# Patient Record
Sex: Female | Born: 1955
Health system: Southern US, Community
[De-identification: ages and names within clinical notes are randomized; demographics above are authoritative.]

## PROBLEM LIST (undated history)

## (undated) DIAGNOSIS — Z8489 Family history of other specified conditions: Secondary | ICD-10-CM

## (undated) DIAGNOSIS — H9190 Unspecified hearing loss, unspecified ear: Secondary | ICD-10-CM

## (undated) DIAGNOSIS — Z9889 Other specified postprocedural states: Secondary | ICD-10-CM

## (undated) DIAGNOSIS — K219 Gastro-esophageal reflux disease without esophagitis: Secondary | ICD-10-CM

## (undated) DIAGNOSIS — F329 Major depressive disorder, single episode, unspecified: Secondary | ICD-10-CM

## (undated) DIAGNOSIS — F32A Depression, unspecified: Secondary | ICD-10-CM

## (undated) DIAGNOSIS — D649 Anemia, unspecified: Secondary | ICD-10-CM

## (undated) DIAGNOSIS — M1990 Unspecified osteoarthritis, unspecified site: Secondary | ICD-10-CM

## (undated) DIAGNOSIS — R112 Nausea with vomiting, unspecified: Secondary | ICD-10-CM

## (undated) DIAGNOSIS — G4730 Sleep apnea, unspecified: Secondary | ICD-10-CM

## (undated) DIAGNOSIS — T8859XA Other complications of anesthesia, initial encounter: Secondary | ICD-10-CM

## (undated) DIAGNOSIS — E039 Hypothyroidism, unspecified: Secondary | ICD-10-CM

## (undated) DIAGNOSIS — T4145XA Adverse effect of unspecified anesthetic, initial encounter: Secondary | ICD-10-CM

## (undated) HISTORY — PX: OTHER SURGICAL HISTORY: SHX169

## (undated) HISTORY — PX: TONSILLECTOMY: SUR1361

## (undated) HISTORY — PX: COLONOSCOPY: SHX174

---

## 1998-12-01 ENCOUNTER — Other Ambulatory Visit: Admission: RE | Admit: 1998-12-01 | Discharge: 1998-12-01 | Payer: Self-pay | Admitting: Obstetrics and Gynecology

## 1999-02-15 ENCOUNTER — Other Ambulatory Visit: Admission: RE | Admit: 1999-02-15 | Discharge: 1999-02-15 | Payer: Self-pay | Admitting: Obstetrics and Gynecology

## 1999-12-07 ENCOUNTER — Other Ambulatory Visit: Admission: RE | Admit: 1999-12-07 | Discharge: 1999-12-07 | Payer: Self-pay | Admitting: Obstetrics and Gynecology

## 2000-12-26 ENCOUNTER — Other Ambulatory Visit: Admission: RE | Admit: 2000-12-26 | Discharge: 2000-12-26 | Payer: Self-pay | Admitting: Obstetrics and Gynecology

## 2004-03-29 ENCOUNTER — Other Ambulatory Visit: Admission: RE | Admit: 2004-03-29 | Discharge: 2004-03-29 | Payer: Self-pay | Admitting: Obstetrics and Gynecology

## 2005-04-04 ENCOUNTER — Other Ambulatory Visit: Admission: RE | Admit: 2005-04-04 | Discharge: 2005-04-04 | Payer: Self-pay | Admitting: Obstetrics and Gynecology

## 2005-11-19 HISTORY — PX: CARPAL TUNNEL RELEASE: SHX101

## 2006-04-24 ENCOUNTER — Other Ambulatory Visit: Admission: RE | Admit: 2006-04-24 | Discharge: 2006-04-24 | Payer: Self-pay | Admitting: Obstetrics and Gynecology

## 2006-11-08 ENCOUNTER — Ambulatory Visit (HOSPITAL_BASED_OUTPATIENT_CLINIC_OR_DEPARTMENT_OTHER): Admission: RE | Admit: 2006-11-08 | Discharge: 2006-11-08 | Payer: Self-pay | Admitting: Orthopedic Surgery

## 2007-04-28 ENCOUNTER — Other Ambulatory Visit: Admission: RE | Admit: 2007-04-28 | Discharge: 2007-04-28 | Payer: Self-pay | Admitting: Obstetrics and Gynecology

## 2008-05-18 ENCOUNTER — Other Ambulatory Visit: Admission: RE | Admit: 2008-05-18 | Discharge: 2008-05-18 | Payer: Self-pay | Admitting: Obstetrics and Gynecology

## 2008-11-19 HISTORY — PX: ABDOMINAL HYSTERECTOMY: SHX81

## 2008-11-19 HISTORY — PX: ANTERIOR AND POSTERIOR REPAIR: SHX1172

## 2009-10-04 ENCOUNTER — Ambulatory Visit (HOSPITAL_COMMUNITY): Admission: RE | Admit: 2009-10-04 | Discharge: 2009-10-05 | Payer: Self-pay | Admitting: Obstetrics and Gynecology

## 2009-10-04 ENCOUNTER — Encounter: Payer: Self-pay | Admitting: Obstetrics and Gynecology

## 2011-02-21 LAB — COMPREHENSIVE METABOLIC PANEL
Alkaline Phosphatase: 71 U/L (ref 39–117)
BUN: 11 mg/dL (ref 6–23)
Chloride: 107 mEq/L (ref 96–112)
Glucose, Bld: 76 mg/dL (ref 70–99)
Potassium: 4.2 mEq/L (ref 3.5–5.1)
Total Bilirubin: 0.8 mg/dL (ref 0.3–1.2)

## 2011-02-21 LAB — TYPE AND SCREEN
ABO/RH(D): O NEG
Antibody Screen: NEGATIVE

## 2011-02-21 LAB — CBC
HCT: 30.9 % — ABNORMAL LOW (ref 36.0–46.0)
HCT: 41.8 % (ref 36.0–46.0)
Hemoglobin: 14.3 g/dL (ref 12.0–15.0)
MCV: 94.2 fL (ref 78.0–100.0)
MCV: 94.3 fL (ref 78.0–100.0)
Platelets: 152 10*3/uL (ref 150–400)
Platelets: 165 10*3/uL (ref 150–400)
RBC: 3.26 MIL/uL — ABNORMAL LOW (ref 3.87–5.11)
RDW: 12.7 % (ref 11.5–15.5)
WBC: 7.4 10*3/uL (ref 4.0–10.5)
WBC: 8.6 10*3/uL (ref 4.0–10.5)
WBC: 5.7 10*3/uL (ref 4.0–10.5)

## 2011-02-21 LAB — HCG, SERUM, QUALITATIVE: Preg, Serum: NEGATIVE

## 2011-02-21 LAB — PROTIME-INR: Prothrombin Time: 13.1 seconds (ref 11.6–15.2)

## 2011-03-21 ENCOUNTER — Other Ambulatory Visit: Payer: Self-pay

## 2011-03-21 DIAGNOSIS — R52 Pain, unspecified: Secondary | ICD-10-CM

## 2011-03-21 DIAGNOSIS — R609 Edema, unspecified: Secondary | ICD-10-CM

## 2011-03-23 ENCOUNTER — Ambulatory Visit
Admission: RE | Admit: 2011-03-23 | Discharge: 2011-03-23 | Disposition: A | Discharge status: Home / Self Care | Payer: Self-pay | Source: Ambulatory Visit

## 2011-03-23 ENCOUNTER — Ambulatory Visit
Admission: RE | Admit: 2011-03-23 | Discharge: 2011-03-23 | Disposition: A | Discharge status: Home / Self Care | Payer: BC Managed Care – PPO | Source: Ambulatory Visit

## 2011-03-23 DIAGNOSIS — R52 Pain, unspecified: Secondary | ICD-10-CM

## 2011-03-23 DIAGNOSIS — R609 Edema, unspecified: Secondary | ICD-10-CM

## 2011-04-06 NOTE — Op Note (Signed)
NAMEORLANDRIA, KISSNER              ACCOUNT NO.:  [id number]   MEDICAL RECORD NO.:  [id number]          PATIENT TYPE:  AMB   LOCATION:  DSC                          FACILITY:  [facility]   PHYSICIAN:  Katy Fitch. Sypher, M.D. DATE OF BIRTH:  24-Jul-1956   DATE OF PROCEDURE:  11/08/2006  DATE OF DISCHARGE:                               OPERATIVE REPORT   PREOPERATIVE DIAGNOSIS:  Entrapment neuropathy of median nerve, left  carpal tunnel.   POSTOPERATIVE DIAGNOSIS:  Entrapment neuropathy of median nerve, left  carpal tunnel.   OPERATIONS:  Release of left transverse carpal ligament.   OPERATIONS:  Josephine Igo, MD   ASSISTANT:  Annye Rusk PA-C.   ANESTHESIA:  General by LMA.   SUPERVISING ANESTHESIOLOGIST:  Bedelia Person, MD   INDICATIONS:  Kristin [name] is a 55 year old woman referred by Dr.  Lucila Maine of [place], [place] for evaluation and management  of hand numbness.  She is status post release of her right transverse  carpal ligament for painful entrapment neuropathy.  She now requests  release of the left transverse carpal ligament.   Due to a failure to respond to nonoperative measures, she is brought to  the operating room at this time.   PROCEDURE:  Devyn Gombert was brought to the operating room and placed  in supine position upon the operating table.   Following the induction of general anesthesia by LMA technique, the left  arm was prepped with Betadine soap and solution and sterilely draped.  A  pneumatic tourniquet was applied to the proximal left brachium.   Following exsanguination of the left arm with an Esmarch bandage, an  arterial tourniquet on the proximal brachium was inflated to 220 mmHg.   The procedure commenced with short incision in the line of the ring  finger and the palm.  Subcutaneous tissues were carefully divided,  revealing the palmar fascia.  This was split longitudinally to reveal  the common sensory branch of the median nerve.   These were followed back  to the transverse carpal ligament, which was gently isolated from the  median nerve.   The ligament was then released along its ulnar border, extending into  the distal forearm.  This widely opened the carpal canal.  No masses or  other predicaments were noted.   Bleeding points along the margin of the released ligament were  electrocauterized with bipolar current followed by repair of the skin  with intradermal 3-0 Prolene suture.   A compressive dressing was applied with a volar plaster splint to  maintain the wrist in 5 degrees of dorsiflexion.  For aftercare, Ms.  Fickel is provided a prescription for Percocet 5 mg one p.o. q.4-6 h.  p.r.n. pain, 20 tablets without refill.   She will return to see me in the office for followup in 1 week or sooner  p.r.n. problems.      Katy Fitch Sypher, M.D.  Electronically Signed     RVS/MEDQ  D:  11/08/2006  T:  11/09/2006  Job:  [id number]   cc:   Angeline Slim MD

## 2011-11-20 HISTORY — PX: RECTAL PROLAPSE REPAIR: SHX759

## 2012-01-22 DIAGNOSIS — E038 Other specified hypothyroidism: Secondary | ICD-10-CM | POA: Insufficient documentation

## 2013-01-07 DIAGNOSIS — N3281 Overactive bladder: Secondary | ICD-10-CM | POA: Insufficient documentation

## 2013-01-07 DIAGNOSIS — N816 Rectocele: Secondary | ICD-10-CM | POA: Insufficient documentation

## 2013-01-07 DIAGNOSIS — N952 Postmenopausal atrophic vaginitis: Secondary | ICD-10-CM | POA: Insufficient documentation

## 2013-02-18 ENCOUNTER — Other Ambulatory Visit (HOSPITAL_COMMUNITY): Payer: Self-pay | Admitting: Orthopedic Surgery

## 2013-02-18 DIAGNOSIS — M25512 Pain in left shoulder: Secondary | ICD-10-CM

## 2013-02-18 DIAGNOSIS — M75101 Unspecified rotator cuff tear or rupture of right shoulder, not specified as traumatic: Secondary | ICD-10-CM

## 2013-02-24 ENCOUNTER — Ambulatory Visit (HOSPITAL_COMMUNITY)
Admission: RE | Admit: 2013-02-24 | Discharge: 2013-02-24 | Disposition: A | Discharge status: Home / Self Care | Payer: BC Managed Care – PPO | Source: Ambulatory Visit | Attending: Orthopedic Surgery | Admitting: Orthopedic Surgery

## 2013-02-24 DIAGNOSIS — M75100 Unspecified rotator cuff tear or rupture of unspecified shoulder, not specified as traumatic: Secondary | ICD-10-CM | POA: Insufficient documentation

## 2013-02-24 DIAGNOSIS — M75101 Unspecified rotator cuff tear or rupture of right shoulder, not specified as traumatic: Secondary | ICD-10-CM

## 2013-02-24 DIAGNOSIS — IMO0002 Reserved for concepts with insufficient information to code with codable children: Secondary | ICD-10-CM | POA: Insufficient documentation

## 2013-02-24 DIAGNOSIS — M25512 Pain in left shoulder: Secondary | ICD-10-CM

## 2013-03-24 ENCOUNTER — Encounter (HOSPITAL_BASED_OUTPATIENT_CLINIC_OR_DEPARTMENT_OTHER): Payer: Self-pay | Admitting: *Deleted

## 2013-03-24 NOTE — Progress Notes (Signed)
Denies any cardiac or resp problems-no labs needed-called dr Lorin Picket for any notes-sleep apnea-uses a cpap-to bring all meds, cpap, and overnight bag

## 2013-03-27 ENCOUNTER — Other Ambulatory Visit: Payer: Self-pay | Admitting: Orthopedic Surgery

## 2013-03-30 NOTE — H&P (Signed)
Kristin [name] is an 57 y.o. female.   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 I have known her and her husband for years. She had a history of a fall at home on 07-15-12 sustaining a significant injury to her right shoulder. She was seen by Dr. Lucila Maine where x-rays revealed no fracture. She went to see Dr. Lynnette Caffey. Dr. Lynnette Caffey identified weakness and sent her for an MRI on 08-28-12. This revealed a massive tear of the supraspinatus and subscapularis   Past Medical History  Diagnosis Date  . Arthritis   . GERD (gastroesophageal reflux disease)   . Sleep apnea     uses a cpap  . Hypothyroidism   . Depression   . Anemia     takes OTC iron  . HOH (hard of hearing)   . Family history of anesthesia complication     mom gets nausea    Past Surgical History  Procedure Laterality Date  . Tonsillectomy    . Abdominal hysterectomy  2010    total vag hyst  . Anterior and posterior repair  2010    cystcele/rectocele done with hyst  . Rectal prolapse repair  2013  . Carpal tunnel release  2007    left-DSC  . Colonoscopy      History reviewed. No pertinent family history. Social History:  reports that she has never smoked. She does not have any smokeless tobacco history on file. She reports that she does not drink alcohol or use illicit drugs.  Allergies:  Allergies  Allergen Reactions  . Dilaudid (Hydromorphone Hcl) Nausea And Vomiting    itching    No prescriptions prior to admission    No results found for this or any previous visit (from the past 48 hour(s)).  No results found.   Pertinent items are noted in HPI.  Height 5\' 2"  (1.575 m), weight 90.719 kg (200 lb).  General appearance: alert Head: Normocephalic, without obvious abnormality Neck: supple, symmetrical, trachea midline Resp: clear to auscultation bilaterally Cardio:  regular rate and rhythm GI: normal findings: bowel sounds normal Extremities:. Her shoulder ROM reveals combined elevation right shoulder 170, left shoulder 175, external rotation right and left shoulders 90 degrees symmetrical bilaterally, internal rotation T10 right and T12 left. She has weakness of abduction, weakness of scaption, and weakness of external rotation at neutral and at 90 degrees abduction. She has a painful but intact push off test with more strength on the left than the right. Despite the appearance of her MRI she appears to have at least half of her subscapularis intact.  Pulses: 2+ and symmetric Skin: normal Neurologic: Grossly normal    Assessment/Plan Impression:Right shoulder impingement with A/C arthrosis and massive multitendon rotator cuff tear.  This is a chronic predicament and I am the third orthopaedic MD consulted.  I have recommended that we scope the shoulder to determine the possibility for repair and plan to attempt partial or complete repair of the tendons as our findings dictate.  No guarantees have been made or implied.  This is a " best effort" to salvage a desperate predicament.  Mr and Mrs Gotay have thoughtfully considered my recommendations for several weeks and have chosen to proceed at this time.  Plan:To the OR for right SA with SAD/DCR and RC repair as needed.The procedure, risks,benefits and post-op course were discussed with the patient at length and  they were in agreement with the plan.   DASNOIT,Kristin [name] 03/30/2013, 10:24 PM  H&P documentation: 03/31/2013  -History and Physical Reviewed  -Patient has been re-examined  -No change in the plan of care  Wyn Forster, MD

## 2013-03-31 ENCOUNTER — Encounter (HOSPITAL_BASED_OUTPATIENT_CLINIC_OR_DEPARTMENT_OTHER): Payer: Self-pay | Admitting: Anesthesiology

## 2013-03-31 ENCOUNTER — Ambulatory Visit (HOSPITAL_BASED_OUTPATIENT_CLINIC_OR_DEPARTMENT_OTHER)
Admission: RE | Admit: 2013-03-31 | Discharge: 2013-04-01 | Disposition: A | Discharge status: Home / Self Care | Payer: BC Managed Care – PPO | Source: Ambulatory Visit | Attending: Orthopedic Surgery | Admitting: Orthopedic Surgery

## 2013-03-31 ENCOUNTER — Encounter (HOSPITAL_BASED_OUTPATIENT_CLINIC_OR_DEPARTMENT_OTHER)
Admission: RE | Disposition: A | Discharge status: Home / Self Care | Payer: Self-pay | Source: Ambulatory Visit | Attending: Orthopedic Surgery

## 2013-03-31 ENCOUNTER — Ambulatory Visit (HOSPITAL_BASED_OUTPATIENT_CLINIC_OR_DEPARTMENT_OTHER): Payer: BC Managed Care – PPO | Admitting: Anesthesiology

## 2013-03-31 DIAGNOSIS — S43429A Sprain of unspecified rotator cuff capsule, initial encounter: Secondary | ICD-10-CM | POA: Insufficient documentation

## 2013-03-31 DIAGNOSIS — M25819 Other specified joint disorders, unspecified shoulder: Secondary | ICD-10-CM | POA: Insufficient documentation

## 2013-03-31 DIAGNOSIS — G4730 Sleep apnea, unspecified: Secondary | ICD-10-CM | POA: Insufficient documentation

## 2013-03-31 DIAGNOSIS — M810 Age-related osteoporosis without current pathological fracture: Secondary | ICD-10-CM | POA: Insufficient documentation

## 2013-03-31 DIAGNOSIS — M19019 Primary osteoarthritis, unspecified shoulder: Secondary | ICD-10-CM | POA: Insufficient documentation

## 2013-03-31 DIAGNOSIS — Z885 Allergy status to narcotic agent status: Secondary | ICD-10-CM | POA: Insufficient documentation

## 2013-03-31 DIAGNOSIS — H9390 Unspecified disorder of ear, unspecified ear: Secondary | ICD-10-CM | POA: Insufficient documentation

## 2013-03-31 DIAGNOSIS — F329 Major depressive disorder, single episode, unspecified: Secondary | ICD-10-CM | POA: Insufficient documentation

## 2013-03-31 DIAGNOSIS — Y92009 Unspecified place in unspecified non-institutional (private) residence as the place of occurrence of the external cause: Secondary | ICD-10-CM | POA: Insufficient documentation

## 2013-03-31 DIAGNOSIS — W19XXXA Unspecified fall, initial encounter: Secondary | ICD-10-CM | POA: Insufficient documentation

## 2013-03-31 DIAGNOSIS — E039 Hypothyroidism, unspecified: Secondary | ICD-10-CM | POA: Insufficient documentation

## 2013-03-31 DIAGNOSIS — K219 Gastro-esophageal reflux disease without esophagitis: Secondary | ICD-10-CM | POA: Insufficient documentation

## 2013-03-31 DIAGNOSIS — D649 Anemia, unspecified: Secondary | ICD-10-CM | POA: Insufficient documentation

## 2013-03-31 DIAGNOSIS — M129 Arthropathy, unspecified: Secondary | ICD-10-CM | POA: Insufficient documentation

## 2013-03-31 DIAGNOSIS — F3289 Other specified depressive episodes: Secondary | ICD-10-CM | POA: Insufficient documentation

## 2013-03-31 HISTORY — DX: Unspecified hearing loss, unspecified ear: H91.90

## 2013-03-31 HISTORY — DX: Major depressive disorder, single episode, unspecified: F32.9

## 2013-03-31 HISTORY — DX: Unspecified osteoarthritis, unspecified site: M19.90

## 2013-03-31 HISTORY — DX: Depression, unspecified: F32.A

## 2013-03-31 HISTORY — DX: Hypothyroidism, unspecified: E03.9

## 2013-03-31 HISTORY — PX: SHOULDER ARTHROSCOPY: SHX128

## 2013-03-31 HISTORY — DX: Gastro-esophageal reflux disease without esophagitis: K21.9

## 2013-03-31 HISTORY — DX: Sleep apnea, unspecified: G47.30

## 2013-03-31 HISTORY — DX: Anemia, unspecified: D64.9

## 2013-03-31 HISTORY — DX: Family history of other specified conditions: Z84.89

## 2013-03-31 SURGERY — ARTHROSCOPY, SHOULDER
Anesthesia: Regional | Site: Shoulder | Laterality: Right

## 2013-03-31 MED ORDER — CHLORHEXIDINE GLUCONATE 4 % EX LIQD: 60.0000 mL | Freq: Once | CUTANEOUS | Status: DC

## 2013-03-31 MED ORDER — CEFAZOLIN SODIUM-DEXTROSE 2-3 GM-% IV SOLR
2.0000 g | INTRAVENOUS | Status: AC
  Administered 2013-03-31: 2 g via INTRAVENOUS

## 2013-03-31 MED ORDER — METOCLOPRAMIDE HCL 5 MG PO TABS: 5.0000 mg | ORAL_TABLET | Freq: Three times a day (TID) | ORAL | Status: DC | PRN

## 2013-03-31 MED ORDER — IBUPROFEN 600 MG PO TABS: 600.0000 mg | ORAL_TABLET | Freq: Four times a day (QID) | ORAL | Status: DC | PRN

## 2013-03-31 MED ORDER — EPHEDRINE SULFATE 50 MG/ML IJ SOLN
INTRAMUSCULAR | Status: DC | PRN
  Administered 2013-03-31: 5 mg via INTRAVENOUS

## 2013-03-31 MED ORDER — ONDANSETRON HCL 4 MG PO TABS: 4.0000 mg | ORAL_TABLET | Freq: Four times a day (QID) | ORAL | Status: DC | PRN

## 2013-03-31 MED ORDER — DEXAMETHASONE SODIUM PHOSPHATE 4 MG/ML IJ SOLN
INTRAMUSCULAR | Status: DC | PRN
  Administered 2013-03-31: 8 mg via INTRAVENOUS

## 2013-03-31 MED ORDER — METOCLOPRAMIDE HCL 5 MG/ML IJ SOLN: 5.0000 mg | Freq: Three times a day (TID) | INTRAMUSCULAR | Status: DC | PRN

## 2013-03-31 MED ORDER — IBUPROFEN 600 MG PO TABS
600.0000 mg | ORAL_TABLET | Freq: Four times a day (QID) | ORAL | Status: DC | PRN
  Administered 2013-03-31 – 2013-04-01 (×2): 600 mg via ORAL

## 2013-03-31 MED ORDER — ONDANSETRON HCL 4 MG/2ML IJ SOLN
INTRAMUSCULAR | Status: DC | PRN
  Administered 2013-03-31: 4 mg via INTRAVENOUS

## 2013-03-31 MED ORDER — OXYCODONE HCL 5 MG PO TABS: 5.0000 mg | ORAL_TABLET | Freq: Once | ORAL | Status: DC | PRN

## 2013-03-31 MED ORDER — MIDAZOLAM HCL 2 MG/2ML IJ SOLN: 0.5000 mg | Freq: Once | INTRAMUSCULAR | Status: DC | PRN

## 2013-03-31 MED ORDER — ROPIVACAINE HCL 5 MG/ML IJ SOLN
INTRAMUSCULAR | Status: DC | PRN
  Administered 2013-03-31: 30 mL via EPIDURAL

## 2013-03-31 MED ORDER — SODIUM CHLORIDE 0.9 % IV SOLN
INTRAVENOUS | Status: DC
  Administered 2013-03-31: 20 mL via INTRAVENOUS

## 2013-03-31 MED ORDER — OXYCODONE HCL 5 MG/5ML PO SOLN: 5.0000 mg | Freq: Once | ORAL | Status: DC | PRN

## 2013-03-31 MED ORDER — FENTANYL CITRATE 0.05 MG/ML IJ SOLN
25.0000 ug | INTRAMUSCULAR | Status: DC | PRN
  Administered 2013-03-31 (×2): 50 ug via INTRAVENOUS

## 2013-03-31 MED ORDER — LACTATED RINGERS IV SOLN
INTRAVENOUS | Status: DC
  Administered 2013-03-31 (×3): via INTRAVENOUS

## 2013-03-31 MED ORDER — CEFAZOLIN SODIUM 1-5 GM-% IV SOLN
1.0000 g | Freq: Four times a day (QID) | INTRAVENOUS | Status: DC
  Administered 2013-03-31 – 2013-04-01 (×2): 1 g via INTRAVENOUS

## 2013-03-31 MED ORDER — SODIUM CHLORIDE 0.9 % IR SOLN
Status: DC | PRN
  Administered 2013-03-31: 6000 mL

## 2013-03-31 MED ORDER — OXYCODONE-ACETAMINOPHEN 5-325 MG PO TABS
1.0000 | ORAL_TABLET | ORAL | Status: DC | PRN
  Administered 2013-03-31 – 2013-04-01 (×5): 2 via ORAL

## 2013-03-31 MED ORDER — ACETAMINOPHEN 10 MG/ML IV SOLN
INTRAVENOUS | Status: DC | PRN
  Administered 2013-03-31: 1000 mg via INTRAVENOUS

## 2013-03-31 MED ORDER — FENTANYL CITRATE 0.05 MG/ML IJ SOLN
INTRAMUSCULAR | Status: DC | PRN
  Administered 2013-03-31: 50 ug via INTRAVENOUS
  Administered 2013-03-31: 100 ug via INTRAVENOUS

## 2013-03-31 MED ORDER — ACETAMINOPHEN 10 MG/ML IV SOLN
1000.0000 mg | Freq: Once | INTRAVENOUS | Status: AC
  Administered 2013-03-31: 1000 mg via INTRAVENOUS

## 2013-03-31 MED ORDER — METHOCARBAMOL 500 MG PO TABS
500.0000 mg | ORAL_TABLET | Freq: Four times a day (QID) | ORAL | Status: DC | PRN
  Administered 2013-03-31 (×2): 500 mg via ORAL

## 2013-03-31 MED ORDER — OXYCODONE-ACETAMINOPHEN 5-325 MG PO TABS
1.0000 | ORAL_TABLET | ORAL | Status: DC | PRN
  Administered 2013-03-31: 2 via ORAL

## 2013-03-31 MED ORDER — MEPERIDINE HCL 25 MG/ML IJ SOLN: 6.2500 mg | INTRAMUSCULAR | Status: DC | PRN

## 2013-03-31 MED ORDER — SUCCINYLCHOLINE CHLORIDE 20 MG/ML IJ SOLN
INTRAMUSCULAR | Status: DC | PRN
  Administered 2013-03-31: 100 mg via INTRAVENOUS

## 2013-03-31 MED ORDER — ONDANSETRON HCL 4 MG/2ML IJ SOLN: 4.0000 mg | Freq: Four times a day (QID) | INTRAMUSCULAR | Status: DC | PRN

## 2013-03-31 MED ORDER — PROMETHAZINE HCL 25 MG/ML IJ SOLN: 6.2500 mg | INTRAMUSCULAR | Status: DC | PRN

## 2013-03-31 MED ORDER — PROPOFOL 10 MG/ML IV BOLUS
INTRAVENOUS | Status: DC | PRN
  Administered 2013-03-31: 30 mg via INTRAVENOUS
  Administered 2013-03-31: 100 mg via INTRAVENOUS

## 2013-03-31 MED ORDER — FENTANYL CITRATE 0.05 MG/ML IJ SOLN
50.0000 ug | INTRAMUSCULAR | Status: DC | PRN
  Administered 2013-03-31: 50 ug via INTRAVENOUS

## 2013-03-31 MED ORDER — CEPHALEXIN 500 MG PO CAPS: 500.0000 mg | ORAL_CAPSULE | Freq: Three times a day (TID) | ORAL | Status: DC

## 2013-03-31 MED ORDER — OXYCODONE-ACETAMINOPHEN 7.5-300 MG PO TABS: ORAL_TABLET | ORAL | Status: DC

## 2013-03-31 MED ORDER — MIDAZOLAM HCL 2 MG/2ML IJ SOLN
1.0000 mg | INTRAMUSCULAR | Status: DC | PRN
  Administered 2013-03-31: 1 mg via INTRAVENOUS

## 2013-03-31 MED ORDER — LIDOCAINE HCL (CARDIAC) 20 MG/ML IV SOLN
INTRAVENOUS | Status: DC | PRN
  Administered 2013-03-31: 40 mg via INTRAVENOUS

## 2013-03-31 MED ORDER — DEXTROSE 5 % IV SOLN: 500.0000 mg | Freq: Four times a day (QID) | INTRAVENOUS | Status: DC | PRN

## 2013-03-31 MED ORDER — GLYCOPYRROLATE 0.2 MG/ML IJ SOLN
INTRAMUSCULAR | Status: DC | PRN
  Administered 2013-03-31 (×2): 0.2 mg via INTRAVENOUS

## 2013-03-31 MED ORDER — LIDOCAINE HCL 4 % MT SOLN
OROMUCOSAL | Status: DC | PRN
  Administered 2013-03-31: 4 mL via TOPICAL

## 2013-03-31 SURGICAL SUPPLY — 79 items
ANCHOR PEEK SWIVEL LOCK 5.5 (Anchor) ×8 IMPLANT
BANDAGE ADHESIVE 1X3 (GAUZE/BANDAGES/DRESSINGS) IMPLANT
BLADE AVERAGE 25X9 (BLADE) IMPLANT
BLADE CUTTER MENIS 5.5 (BLADE) IMPLANT
BLADE SURG 15 STRL LF DISP TIS (BLADE) ×2 IMPLANT
BLADE SURG 15 STRL SS (BLADE) ×2
BUR EGG/OVAL CARBIDE (BURR) ×2 IMPLANT
BUR OVAL 6.0 (BURR) ×2 IMPLANT
CANISTER OMNI JUG 16 LITER (MISCELLANEOUS) ×2 IMPLANT
CANISTER SUCTION 2500CC (MISCELLANEOUS) IMPLANT
CANNULA TWIST IN 8.25X7CM (CANNULA) IMPLANT
CLEANER CAUTERY TIP 5X5 PAD (MISCELLANEOUS) IMPLANT
CLOTH BEACON ORANGE TIMEOUT ST (SAFETY) ×2 IMPLANT
CUTTER MENISCUS  4.2MM (BLADE) ×1
CUTTER MENISCUS 4.2MM (BLADE) ×1 IMPLANT
DECANTER SPIKE VIAL GLASS SM (MISCELLANEOUS) IMPLANT
DRAPE INCISE IOBAN 66X45 STRL (DRAPES) ×2 IMPLANT
DRAPE STERI 35X30 U-POUCH (DRAPES) ×2 IMPLANT
DRAPE SURG 17X23 STRL (DRAPES) ×2 IMPLANT
DRAPE U-SHAPE 47X51 STRL (DRAPES) ×2 IMPLANT
DRAPE U-SHAPE 76X120 STRL (DRAPES) ×4 IMPLANT
DRSG PAD ABDOMINAL 8X10 ST (GAUZE/BANDAGES/DRESSINGS) ×2 IMPLANT
DURAPREP 26ML APPLICATOR (WOUND CARE) ×2 IMPLANT
ELECT REM PT RETURN 9FT ADLT (ELECTROSURGICAL) ×2
ELECTRODE REM PT RTRN 9FT ADLT (ELECTROSURGICAL) ×1 IMPLANT
GLOVE BIO SURGEON STRL SZ 6.5 (GLOVE) ×2 IMPLANT
GLOVE BIO SURGEON STRL SZ7 (GLOVE) ×2 IMPLANT
GLOVE BIOGEL M STRL SZ7.5 (GLOVE) ×2 IMPLANT
GLOVE BIOGEL PI IND STRL 7.0 (GLOVE) ×1 IMPLANT
GLOVE BIOGEL PI IND STRL 8 (GLOVE) ×2 IMPLANT
GLOVE BIOGEL PI INDICATOR 7.0 (GLOVE) ×1
GLOVE BIOGEL PI INDICATOR 8 (GLOVE) ×2
GLOVE EXAM NITRILE PF MED BLUE (GLOVE) ×2 IMPLANT
GLOVE ORTHO TXT STRL SZ7.5 (GLOVE) ×2 IMPLANT
GOWN BRE IMP PREV XXLGXLNG (GOWN DISPOSABLE) ×4 IMPLANT
GOWN PREVENTION PLUS XXLARGE (GOWN DISPOSABLE) ×2 IMPLANT
NDL SUT 6 .5 CRC .975X.05 MAYO (NEEDLE) IMPLANT
NEEDLE MAYO TAPER (NEEDLE)
NEEDLE MINI RC 24MM (NEEDLE) IMPLANT
NEEDLE SCORPION (NEEDLE) ×2 IMPLANT
PACK ARTHROSCOPY DSU (CUSTOM PROCEDURE TRAY) ×2 IMPLANT
PACK BASIN DAY SURGERY FS (CUSTOM PROCEDURE TRAY) ×2 IMPLANT
PAD CLEANER CAUTERY TIP 5X5 (MISCELLANEOUS)
PASSER SUT SWANSON 36MM LOOP (INSTRUMENTS) IMPLANT
PENCIL BUTTON HOLSTER BLD 10FT (ELECTRODE) ×2 IMPLANT
SLEEVE SCD COMPRESS KNEE MED (MISCELLANEOUS) ×2 IMPLANT
SLING ARM FOAM STRAP LRG (SOFTGOODS) ×2 IMPLANT
SLING ARM FOAM STRAP MED (SOFTGOODS) IMPLANT
SPONGE GAUZE 4X4 12PLY (GAUZE/BANDAGES/DRESSINGS) ×2 IMPLANT
SPONGE LAP 4X18 X RAY DECT (DISPOSABLE) ×2 IMPLANT
STRIP CLOSURE SKIN 1/2X4 (GAUZE/BANDAGES/DRESSINGS) ×2 IMPLANT
SUCTION FRAZIER TIP 10 FR DISP (SUCTIONS) IMPLANT
SUT ETHIBOND 2 OS 4 DA (SUTURE) IMPLANT
SUT ETHILON 4 0 PS 2 18 (SUTURE) IMPLANT
SUT FIBERWIRE #2 38 T-5 BLUE (SUTURE) ×2
SUT FIBERWIRE 3-0 18 TAPR NDL (SUTURE)
SUT PROLENE 1 CT (SUTURE) IMPLANT
SUT PROLENE 3 0 PS 2 (SUTURE) ×2 IMPLANT
SUT TIGER TAPE 7 IN WHITE (SUTURE) ×4 IMPLANT
SUT VIC AB 0 CT1 27 (SUTURE) ×1
SUT VIC AB 0 CT1 27XBRD ANBCTR (SUTURE) ×1 IMPLANT
SUT VIC AB 0 SH 27 (SUTURE) ×2 IMPLANT
SUT VIC AB 2-0 SH 27 (SUTURE) ×1
SUT VIC AB 2-0 SH 27XBRD (SUTURE) ×1 IMPLANT
SUT VIC AB 3-0 SH 27 (SUTURE)
SUT VIC AB 3-0 SH 27X BRD (SUTURE) IMPLANT
SUT VIC AB 3-0 X1 27 (SUTURE) IMPLANT
SUTURE FIBERWR #2 38 T-5 BLUE (SUTURE) ×1 IMPLANT
SUTURE FIBERWR 3-0 18 TAPR NDL (SUTURE) IMPLANT
SYR 3ML 23GX1 SAFETY (SYRINGE) IMPLANT
SYR BULB 3OZ (MISCELLANEOUS) IMPLANT
TAPE FIBER 2MM 7IN #2 BLUE (SUTURE) ×4 IMPLANT
TAPE PAPER 3X10 WHT MICROPORE (GAUZE/BANDAGES/DRESSINGS) ×2 IMPLANT
TOWEL OR 17X24 6PK STRL BLUE (TOWEL DISPOSABLE) ×2 IMPLANT
TUBE CONNECTING 20X1/4 (TUBING) ×2 IMPLANT
TUBING ARTHROSCOPY IRRIG 16FT (MISCELLANEOUS) ×2 IMPLANT
WAND STAR VAC 90 (SURGICAL WAND) ×2 IMPLANT
WATER STERILE IRR 1000ML POUR (IV SOLUTION) ×2 IMPLANT
YANKAUER SUCT BULB TIP NO VENT (SUCTIONS) IMPLANT

## 2013-03-31 NOTE — Progress Notes (Signed)
Assisted Dr. Jackson with right, interscalene  block. Side rails up, monitors on throughout procedure. See vital signs in flow sheet. Tolerated Procedure well. 

## 2013-03-31 NOTE — Transfer of Care (Signed)
Immediate Anesthesia Transfer of Care Note  Patient: Kristin [name]  Procedure(s) Performed: Procedure(s): RIGHT SHOULDER SCOPE, POSSIBLE SAD, DCR (Right)  Patient Location: PACU  Anesthesia Type:GA combined with regional for post-op pain  Level of Consciousness: awake, alert  and oriented  Airway & Oxygen Therapy: Patient Spontanous Breathing and Patient connected to face mask oxygen  Post-op Assessment: Report given to PACU RN, Post -op Vital signs reviewed and stable and Patient moving all extremities  Post vital signs: Reviewed and stable  Complications: No apparent anesthesia complications

## 2013-03-31 NOTE — Brief Op Note (Signed)
03/31/2013  3:38 PM  PATIENT:  Lachele M Klare  57 y.o. female  PRE-OPERATIVE DIAGNOSIS:  MASSIVE ROTATOR CUFF TEAR RIGHT  POST-OPERATIVE DIAGNOSIS:  MASSIVE ROTATOR CUFF TEAR RIGHT  PROCEDURE:  Procedure(s): RIGHT SHOULDER SCOPE, POSSIBLE SAD, DCR (Right)  SURGEON:  Surgeon(s) and Role:    * Wyn Forster., MD - Primary  PHYSICIAN ASSISTANT:   ASSISTANTS: Mallory Shirk.A-C    ANESTHESIA:   general  EBL:  Total I/O In: 2000 [I.V.:2000] Out: -   BLOOD ADMINISTERED:none  DRAINS: none   LOCAL MEDICATIONS USED:  Ropivacaine plexus block  SPECIMEN:  No Specimen  DISPOSITION OF SPECIMEN:  N/A  COUNTS:  YES  TOURNIQUET:  * No tourniquets in log *  DICTATION: .Other Dictation: Dictation Number [id number]  PLAN OF CARE: Admit for overnight observation  PATIENT DISPOSITION:  PACU - hemodynamically stable.   Delay start of Pharmacological VTE agent (>24hrs) due to surgical blood loss or risk of bleeding: not applicable

## 2013-03-31 NOTE — Anesthesia Preprocedure Evaluation (Signed)
Anesthesia Evaluation  Patient identified by MRN, date of birth, ID band Patient awake    Reviewed: Allergy & Precautions, H&P , NPO status , Patient's Chart, lab work & pertinent test results  History of Anesthesia Complications Negative for: history of anesthetic complications  Airway Mallampati: II TM Distance: >3 FB Neck ROM: Full    Dental  (+) Teeth Intact and Dental Advisory Given   Pulmonary sleep apnea and Continuous Positive Airway Pressure Ventilation ,  breath sounds clear to auscultation  Pulmonary exam normal       Cardiovascular negative cardio ROS  Rhythm:Regular Rate:Normal     Neuro/Psych PSYCHIATRIC DISORDERS Depression negative neurological ROS  negative psych ROS   GI/Hepatic Neg liver ROS, GERD-  Medicated and Controlled,  Endo/Other  Hypothyroidism Morbid obesity  Renal/GU negative Renal ROS     Musculoskeletal   Abdominal (+) + obese,   Peds  Hematology   Anesthesia Other Findings   Reproductive/Obstetrics                           Anesthesia Physical Anesthesia Plan  ASA: III  Anesthesia Plan: General   Post-op Pain Management:    Induction: Intravenous  Airway Management Planned: Oral ETT  Additional Equipment:   Intra-op Plan:   Post-operative Plan: Extubation in OR  Informed Consent: I have reviewed the patients History and Physical, chart, labs and discussed the procedure including the risks, benefits and alternatives for the proposed anesthesia with the patient or authorized representative who has indicated his/her understanding and acceptance.   Dental advisory given  Plan Discussed with: CRNA and Surgeon  Anesthesia Plan Comments: (Plan routine monitors, GETA with interscalene block for post op analgesia )        Anesthesia Quick Evaluation

## 2013-03-31 NOTE — Anesthesia Postprocedure Evaluation (Signed)
  Anesthesia Post-op Note  Patient: Kristin [name]  Procedure(s) Performed: Procedure(s): RIGHT SHOULDER SCOPE, POSSIBLE SAD, DCR (Right)  Patient Location: PACU  Anesthesia Type:GA combined with regional for post-op pain  Level of Consciousness: awake, alert , oriented and patient cooperative  Airway and Oxygen Therapy: Patient Spontanous Breathing and Patient connected to nasal cannula oxygen  Post-op Pain: mild  Post-op Assessment: Post-op Vital signs reviewed, Patient's Cardiovascular Status Stable, Respiratory Function Stable, Patent Airway, No signs of Nausea or vomiting and Pain level controlled  Post-op Vital Signs: Reviewed and stable  Complications: No apparent anesthesia complications

## 2013-03-31 NOTE — Anesthesia Procedure Notes (Addendum)
Anesthesia Regional Block:  Interscalene brachial plexus block  Pre-Anesthetic Checklist: ,, timeout performed, Correct Patient, Correct Site, Correct Laterality, Correct Procedure, Correct Position, site marked, Risks and benefits discussed,  Surgical consent,  Pre-op evaluation,  At surgeon's request and post-op pain management  Laterality: Right  Prep: chloraprep       Needles:  Injection technique: Single-shot  Needle Type: Stimulator Needle - 40     Needle Length: 4cm  Needle Gauge: 22 and 22 G    Additional Needles:  Procedures: nerve stimulator Interscalene brachial plexus block  Nerve Stimulator or Paresthesia:  Response: forearm twitch, 0.45 mA, 0.1 ms,   Additional Responses:   Narrative:  Start time: 03/31/2013 1:09 PM End time: 03/31/2013 1:14 PM Injection made incrementally with aspirations every 5 mL.  Performed by: Personally  Anesthesiologist: Sandford Craze, MD  Additional Notes: Pt identified in Holding room.  Monitors applied. Working IV access confirmed. Sterile prep.  #22ga PNS to forearm twitch at 0.52mA threshold.  30cc 0.5% Ropivacaine injected incrementally after negative test dose.  Patient asymptomatic, VSS, no heme aspirated, tolerated well.  Sandford Craze, MD  Interscalene brachial plexus block Procedure Name: Intubation Date/Time: 03/31/2013 1:39 PM Performed by: Burna Cash Pre-anesthesia Checklist: Patient identified, Emergency Drugs available, Suction available and Patient being monitored Patient Re-evaluated:Patient Re-evaluated prior to inductionOxygen Delivery Method: Circle System Utilized Preoxygenation: Pre-oxygenation with 100% oxygen Intubation Type: IV induction Ventilation: Mask ventilation without difficulty Laryngoscope Size: Mac and 3 Grade View: Grade I Tube type: Oral Number of attempts: 1 Airway Equipment and Method: stylet and oral airway Placement Confirmation: ETT inserted through vocal cords under direct vision,   positive ETCO2 and breath sounds checked- equal and bilateral Secured at: 22 cm Tube secured with: Tape Dental Injury: Teeth and Oropharynx as per pre-operative assessment

## 2013-03-31 NOTE — Transfer of Care (Deleted)
Immediate Anesthesia Transfer of Care Note  Patient: Kristin [name]  Procedure(s) Performed: Procedure(s): RIGHT SHOULDER SCOPE, POSSIBLE SAD, DCR (Right)  Patient Location: PACU  Anesthesia Type:GA combined with regional for post-op pain  Level of Consciousness: awake, alert  and oriented  Airway & Oxygen Therapy: Patient Spontanous Breathing and Patient connected to face mask oxygen  Post-op Assessment: Report given to PACU RN, Post -op Vital signs reviewed and stable and Patient moving all extremities  Post vital signs: Reviewed and stable  Complications: No apparent anesthesia complications 

## 2013-03-31 NOTE — Op Note (Signed)
[id number] 

## 2013-04-01 ENCOUNTER — Encounter (HOSPITAL_BASED_OUTPATIENT_CLINIC_OR_DEPARTMENT_OTHER): Payer: Self-pay | Admitting: Orthopedic Surgery

## 2013-04-01 NOTE — Op Note (Signed)
NAMELUCERITO, ROSINSKI            ACCOUNT NO.:  [id number]  MEDICAL RECORD NO.:  [id number]  LOCATION:  ULT                          FACILITY:  [facility]  PHYSICIAN:  Katy Fitch. Adalea Handler, M.D. DATE OF BIRTH:  [birth year]  DATE OF PROCEDURE:  03/31/2013 DATE OF DISCHARGE:  04/01/2013                              OPERATIVE REPORT   PREOPERATIVE DIAGNOSIS:  Massive avulsion of rotator cuff involving grade 4 avulsion of subscapularis, complete avulsion of the supraspinatus, and partial avulsion of the supraspinatus, right shoulder.  This is chronic, i.e. more than 9 months old.  POSTOPERATIVE DIAGNOSIS:  Massive avulsion of rotator cuff involving grade 4 avulsion of subscapularis, complete avulsion of the supraspinatus, and partial avulsion of the supraspinatus, right shoulder.  This is chronic, i.e. more than 9 months old.  OPERATION:  Diagnostic arthroscopy, right glenohumeral joint with extensive debridement of granulation tissue and adhesions followed by arthroscopic and open reconstruction of subscapularis, supraspinatus, infraspinatus, rotator cuff tendons utilizing multiple FiberTape reverse mattress sutures and swivel locks x4.  OPERATING SURGEON:  Katy Fitch. Deepti Gunawan, MD.  ASSISTANT:  Marveen Reeks Dasnoit, PA-C  ANESTHESIA:  General by endotracheal technique supplemented by a right shoulder ropivacaine plexus block.  SUPERVISING ANESTHESIOLOGIST:  Germaine Pomfret, M.D.  INDICATIONS:  Hedwig Mcfall is an unfortunate 57 year old woman who 9 months prior in a fall sustained a serious injury to her right shoulder. She was initially evaluated by her primary care physician in [place] and was seen by Dr. Tally Joe and a general orthopedic surgeon.  Dr. Tally Joe noted a massive avulsion of the subscapularis and supraspinatus and recommended that surgery not be performed.  An alternative treatment of rehab was recommended per Ms. Eakle's history. She subsequently sought a second opinion in  the [facility] with one other shoulder oriented surgeons and was advised approximately 3 months post injury to proceed with repair.  Hearing different opinion, she became a bit uncertain as to which pathway to follow, therefore she decided to wait a while to determine how her shoulder would function.  She subsequently decided to seek a third opinion with our office as we have treated her in the past.  She returned to see me in April 2014, now nearly 8 months following her injury with questions regarding the possibility of a late repair of the rotator cuff.  We obtain her history, reviewed her MRI and plain films, and noted that she had a grade 4 avulsion of the subscapularis and complete supraspinatus avulsion that was at least 8 months old.  We had a detailed informed consent and pointed out that we could not make any promises or guarantees as to our ability to repair the rotator cuff, however, I did tell her that there was a methodology as a spouse by a number of our thought leaders and orthopedics as to how these can be handled late.  Some individuals believe that these could be repaired more than a year after the injury if proper mobilization of the tendons was performed.  Ms. Coor was advised that a method of arthroscopic evaluation, tenolysis immobilization would be appropriate followed by decision to determine whether or not open or arthroscopic repair was feasible.  I  advised that more likely than not, she would have a hybrid procedure that was part arthroscopic and part open.  After informed consent, she was brought to the operating room at this time.  Preoperatively, she was interviewed by Dr. Jean Rosenthal of Anesthesia and advised to undergo a plexus block.  Due to history of sleep apnea, she was provided ropivacaine plexus block preoperatively.  Excellent anesthesia of the right shoulder was achieved.  She was transferred to room 2 of the [facility] where under Dr. Edison Pace direct supervision, general anesthesia by endotracheal technique was induced.  She was carefully positioned in the beach-chair position with aid of a torso and head holder designed for shoulder arthroscopy.  The entire right arm, hand, and forequarter were prepped with DuraPrep and draped with impervious arthroscopy drapes.  Following routine surgical time-out and confirming that, preoperative Ancef had been provided.  The scope was placed through a standard posterior viewing portal with anterior switching stick technique.  Diagnostic arthroscopy revealed complete avulsion of the subscapularis with extensive granulation tissue within the joint.  The long head of the biceps was ruptured and retracted.  The greater tuberosity was bare and a portion of the infraspinatus remained attached posteriorly.  The teres minor was attached.  There was grade 2 and 3 chondromalacia of the humeral head.  However, due to the marked instability and difficulty with poor bound mechanics of the shoulder, I felt that an attempt to mobilize the subscapularis tendon and supraspinatus tendon was warranted.  Anterior portal was created under direct vision, and a suction shaver was used to debride granulation tissue and adhesions between the subscapularis and the anterior labrum.  An anterior superior lateral portal was used allowing placement of a Kingfisher retractor grabbing the subscapularis and subsequently using the suction shaver to perform further tenolysis and adhesion release.  I was able to grab the free margin of the supraspinatus, and with mobilization in the superior recess was able to bring the supraspinatus to the joint margin with gentle traction.  After thorough evaluation of the mobility of the cuff while this could be challenging, we felt that it would be technically possible to repair at least 2/3rd of the cuff tear and a portion of subscapularis,  which is in many orthopedic surgeons' view the most important rotator cuff tendon.  After completion of the debridement, we removed the arthroscopic equipment and proceeded with a 6-cm anterior middle third deltoid muscle- splitting incision.  The bursa was incised and redundant bursa cleared.  The entire greater tuberosity was exposed.  The entire lesser tuberosity was exposed.  There were reactive osteophytes that had formed, which were subsequently leveled with a suction bur and high-speed bur. With great care, 4 mattress sutures were placed.  Two arthroscopic sutures placed in the subscapularis with the scorpion suture passer brought in thorough the anterior superior lateral portal with direct vision from posterior viewing portal followed by open placement of the reverse mattress sutures in the supraspinatus and anterior infraspinatus.  With great care, we were able to mobilize the tendons to where the supraspinatus had a near anatomic footprint.  The subscapularis was more challenging but ultimately with the arm in internal rotation using arthroscopic cannula anteriorly.  We were able to place the subscapularis at a near anatomic repair site.  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  We then were faced with placing the anchors to repair the  subscapularis more laterally in the good cortical bone in the region of the bicipital groove.  Two fiber tapes and subscapularis were brought to cortical drill holes in the lateral aspect of the humeral shaft, placed in a proper tension with the arm as much internal rotation as possible, secured home with standard swivel lock technique.  The supraspinatus and infraspinatus was repaired with a pair of swivel locks laterally that had excellent purchase in the cortical bone.  A small dog ear in the infraspinatus was trimmed and finished with a mattress suture of #2  FiberWire.  The scope was replaced in glenohumeral joint, and we did note that the supraspinatus tendon was now appropriately relocated to the joint line. We have been able to reestablished at least 50% of the anatomic footprint of the supraspinatus and have brought the subscapularis into a far more anatomic position than the preoperative status.  I could not mobilize the subscapularis completely to the anterior lateral margin of the bicipital groove.  The wound was thoroughly lavaged with sterile saline using the arthroscopic cannula.  I elected not to perform a distal clavicle resection or a significant acromioplasty as preoperative images did not appear to be pathologic.  In the outside chance that the repair will fail, an intact coracoacromial arch will prevent escape of the humeral head.  The deltoid was repaired with figure-of-eight sutures of 0 Vicryl followed by repair of the skin with subcutaneous 0 and 3-0 Vicryl followed by intradermal 3-0 Prolene.  For aftercare, Ms. Getchell was provided Percocet 5 mg 1 to 2 tablets p.o. q.4-6 hours p.r.n. pain and will not be provided Dilaudid due to a stated allergy on the chart.  We may consider the use of an IV medication in the recovery care center, however, given her history of sleep apnea, we will be careful to avoid over sedation.     Katy Fitch Tamey Wanek, M.D.     RVS/MEDQ  D:  03/31/2013  T:  04/01/2013  Job:  [id number]

## 2013-04-01 NOTE — Op Note (Deleted)
NAME:  Kristin [name], Kristin [name]            ACCOUNT NO.:  [id number]  MEDICAL RECORD NO.:  [id number]  LOCATION:  ULT                          FACILITY:  [facility]  PHYSICIAN:  Jonh Mcqueary V. Chemika Nightengale, [name].D. DATE OF BIRTH:  04/18/1956  DATE OF PROCEDURE:  03/31/2013 DATE OF DISCHARGE:  04/01/2013                              OPERATIVE REPORT   PREOPERATIVE DIAGNOSIS:  Massive avulsion of rotator cuff involving grade 4 avulsion of subscapularis, complete avulsion of the supraspinatus, and partial avulsion of the supraspinatus, right shoulder.  This is chronic, i.e. more than 9 months old.  POSTOPERATIVE DIAGNOSIS:  Massive avulsion of rotator cuff involving grade 4 avulsion of subscapularis, complete avulsion of the supraspinatus, and partial avulsion of the supraspinatus, right shoulder.  This is chronic, i.e. more than 9 months old.  OPERATION:  Diagnostic arthroscopy, right glenohumeral joint with extensive debridement of granulation tissue and adhesions followed by arthroscopic and open reconstruction of subscapularis, supraspinatus, infraspinatus, rotator cuff tendons utilizing multiple FiberTape reverse mattress sutures and swivel locks x4.  OPERATING SURGEON:  Aria Jarrard V. Tasheema Perrone, MD.  ASSISTANT:  Haelie Clapp J. Dasnoit, PA-C  ANESTHESIA:  General by endotracheal technique supplemented by a right shoulder ropivacaine plexus block.  SUPERVISING ANESTHESIOLOGIST:  E. Carswell Jackson, [name].D.  INDICATIONS:  Kristin [name] is an unfortunate 57-year-old woman who 9 months prior in a fall sustained a serious injury to her right shoulder. She was initially evaluated by her primary care physician in [place] and was seen by Dr. Cisco and a general orthopedic surgeon.  Dr. Cisco noted a massive avulsion of the subscapularis and supraspinatus and recommended that surgery not be performed.  An alternative treatment of rehab was recommended per Kristin [name]'s history. She subsequently sought a second opinion in  the [facility] with one other shoulder oriented surgeons and was advised approximately 3 months post injury to proceed with repair.  Hearing different opinion, she became a bit uncertain as to which pathway to follow, therefore she decided to wait a while to determine how her shoulder would function.  She subsequently decided to seek a third opinion with our office as we have treated her in the past.  She returned to see me in April 2014, now nearly 8 months following her injury with questions regarding the possibility of a late repair of the rotator cuff.  We obtain her history, reviewed her MRI and plain films, and noted that she had a grade 4 avulsion of the subscapularis and complete supraspinatus avulsion that was at least 8 months old.  We had a detailed informed consent and pointed out that we could not make any promises or guarantees as to our ability to repair the rotator cuff, however, I did tell her that there was a methodology as a spouse by a number of our thought leaders and orthopedics as to how these can be handled late.  Some individuals believe that these could be repaired more than a year after the injury if proper mobilization of the tendons was performed.  Kristin [name] was advised that a method of arthroscopic evaluation, tenolysis immobilization would be appropriate followed by decision to determine whether or not open or arthroscopic repair was feasible.  I   advised that more likely than not, she would have a hybrid procedure that was part arthroscopic and part open.  After informed consent, she was brought to the operating room at this time.  Preoperatively, she was interviewed by Dr. Jackson of Anesthesia and advised to undergo a plexus block.  Due to history of sleep apnea, she was provided ropivacaine plexus block preoperatively.  Excellent anesthesia of the right shoulder was achieved.  She was transferred to room 2 of the [facility] where under Dr. Jackson's direct supervision, general anesthesia by endotracheal technique was induced.  She was carefully positioned in the beach-chair position with aid of a torso and head holder designed for shoulder arthroscopy.  The entire right arm, hand, and forequarter were prepped with DuraPrep and draped with impervious arthroscopy drapes.  Following routine surgical time-out and confirming that, preoperative Ancef had been provided.  The scope was placed through a standard posterior viewing portal with anterior switching stick technique.  Diagnostic arthroscopy revealed complete avulsion of the subscapularis with extensive granulation tissue within the joint.  The long head of the biceps was ruptured and retracted.  The greater tuberosity was bare and a portion of the infraspinatus remained attached posteriorly.  The teres minor was attached.  There was grade 2 and 3 chondromalacia of the humeral head.  However, due to the marked instability and difficulty with poor bound mechanics of the shoulder, I felt that an attempt to mobilize the subscapularis tendon and supraspinatus tendon was warranted.  Anterior portal was created under direct vision, and a suction shaver was used to debride granulation tissue and adhesions between the subscapularis and the anterior labrum.  An anterior superior lateral portal was used allowing placement of a Kingfisher retractor grabbing the subscapularis and subsequently using the suction shaver to perform further tenolysis and adhesion release.  I was able to grab the free margin of the supraspinatus, and with mobilization in the superior recess was able to bring the supraspinatus to the joint margin with gentle traction.  After thorough evaluation of the mobility of the cuff while this could be challenging, we felt that it would be technically possible to repair at least 2/3rd of the cuff tear and a portion of subscapularis,  which is in many orthopedic surgeons' view the most important rotator cuff tendon.  After completion of the debridement, we removed the arthroscopic equipment and proceeded with a 6-cm anterior middle third deltoid muscle- splitting incision.  The bursa was incised and redundant bursa cleared.  The entire greater tuberosity was exposed.  The entire lesser tuberosity was exposed.  There were reactive osteophytes that had formed, which were subsequently leveled with a suction bur and high-speed bur. With great care, 4 mattress sutures were placed.  Two arthroscopic sutures placed in the subscapularis with the scorpion suture passer brought in thorough the anterior superior lateral portal with direct vision from posterior viewing portal followed by open placement of the reverse mattress sutures in the supraspinatus and anterior infraspinatus.  With great care, we were able to mobilize the tendons to where the supraspinatus had a near anatomic footprint.  The subscapularis was more challenging but ultimately with the arm in internal rotation using arthroscopic cannula anteriorly.  We were able to place the subscapularis at a near anatomic repair site.  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  We then were faced with placing the anchors to repair the   subscapularis more laterally in the good cortical bone in the region of the bicipital groove.  Two fiber tapes and subscapularis were brought to cortical drill holes in the lateral aspect of the humeral shaft, placed in a proper tension with the arm as much internal rotation as possible, secured home with standard swivel lock technique.  The supraspinatus and infraspinatus was repaired with a pair of swivel locks laterally that had excellent purchase in the cortical bone.  A small dog ear in the infraspinatus was trimmed and finished with a mattress suture of #2  FiberWire.  The scope was replaced in glenohumeral joint, and we did note that the supraspinatus tendon was now appropriately relocated to the joint line. We have been able to reestablished at least 50% of the anatomic footprint of the supraspinatus and have brought the subscapularis into a far more anatomic position than the preoperative status.  I could not mobilize the subscapularis completely to the anterior lateral margin of the bicipital groove.  The wound was thoroughly lavaged with sterile saline using the arthroscopic cannula.  I elected not to perform a distal clavicle resection or a significant acromioplasty as preoperative images did not appear to be pathologic.  In the outside chance that the repair will fail, an intact coracoacromial arch will prevent escape of the humeral head.  The deltoid was repaired with figure-of-eight sutures of 0 Vicryl followed by repair of the skin with subcutaneous 0 and 3-0 Vicryl followed by intradermal 3-0 Prolene.  For aftercare, Ms. Pandit was provided Percocet 5 mg 1 to 2 tablets p.o. q.4-6 hours p.r.n. pain and will not be provided Dilaudid due to a stated allergy on the chart.  We may consider the use of an IV medication in the recovery care center, however, given her history of sleep apnea, we will be careful to avoid over sedation.     Dorri Ozturk V. Tierra Thoma, [name].D.     RVS/MEDQ  D:  03/31/2013  T:  04/01/2013  Job:  [id number] 

## 2014-06-14 IMAGING — US US EXTREM UP BILAT COMPLETE
1 series · 14 of 18 positions shown · non-contrast
Comparison: none

INDICATIONS: bilateral shoulder pain

ULTRASOUND OF BOTH SHOULDERS

[Series 1: us extrem up bilat complete · 0.09mm/px · 18 acquisitions, 14 frames shown]
[im 1/18]
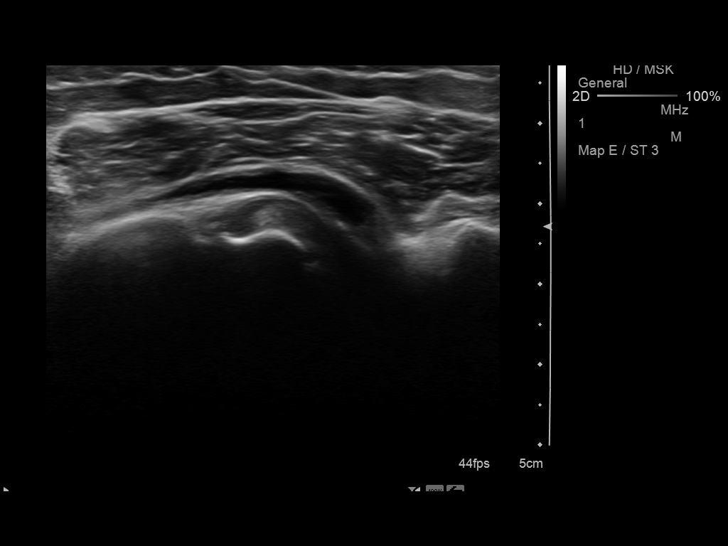
[im 2/18]
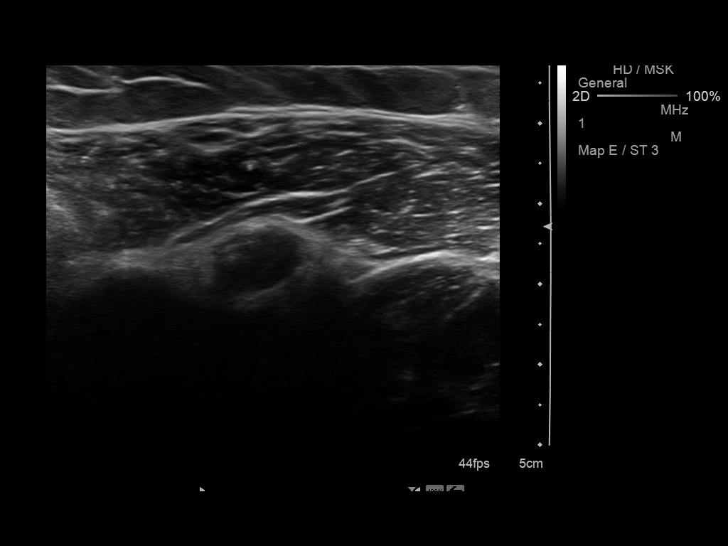
[im 4/18]
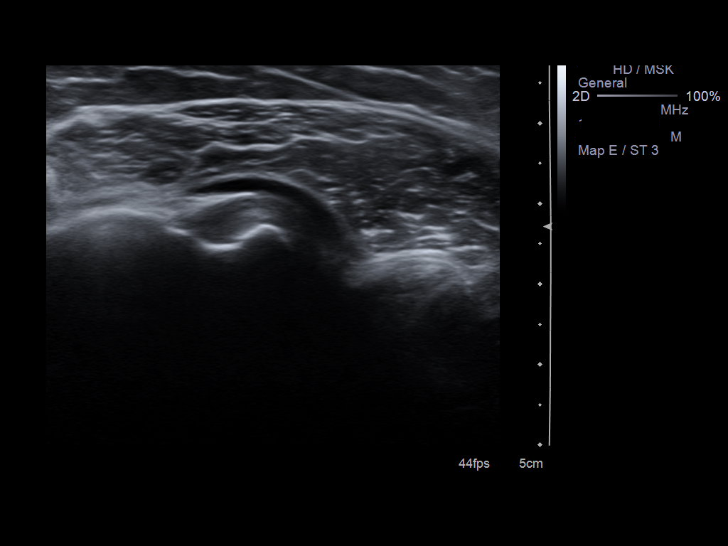
[im 5/18]
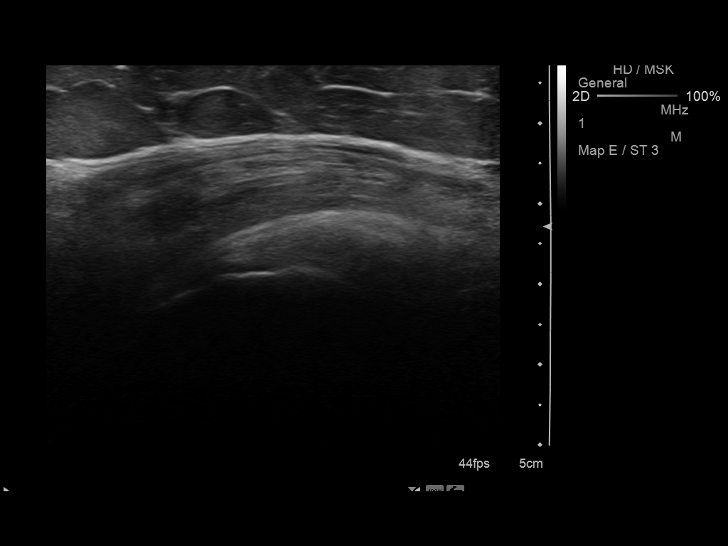
[im 6/18]
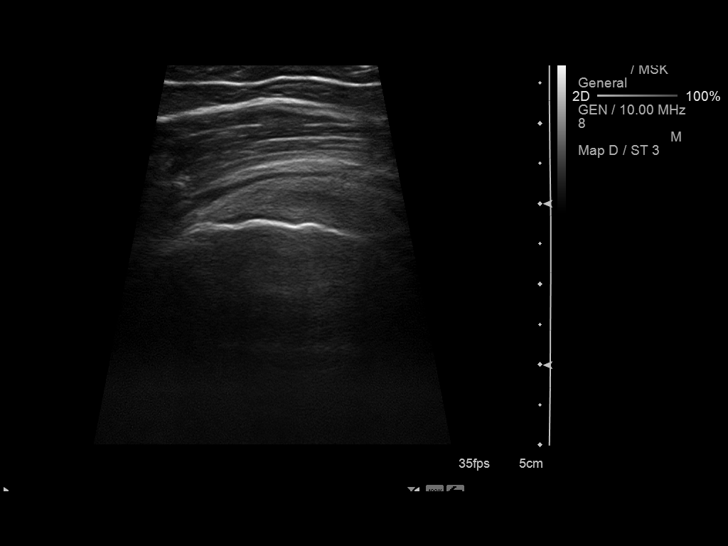
[im 8/18]
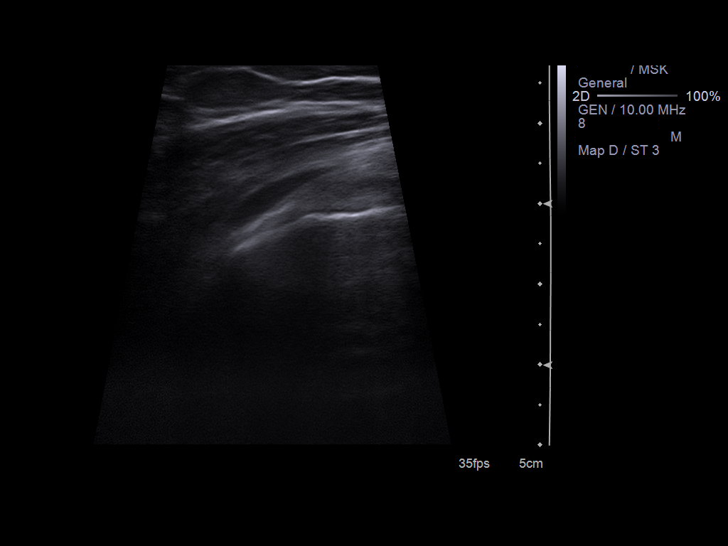
[im 9/18]
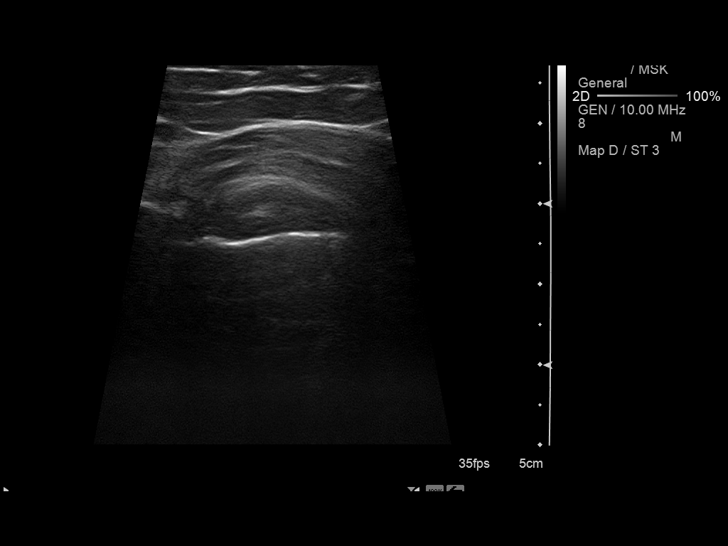
[im 10/18]
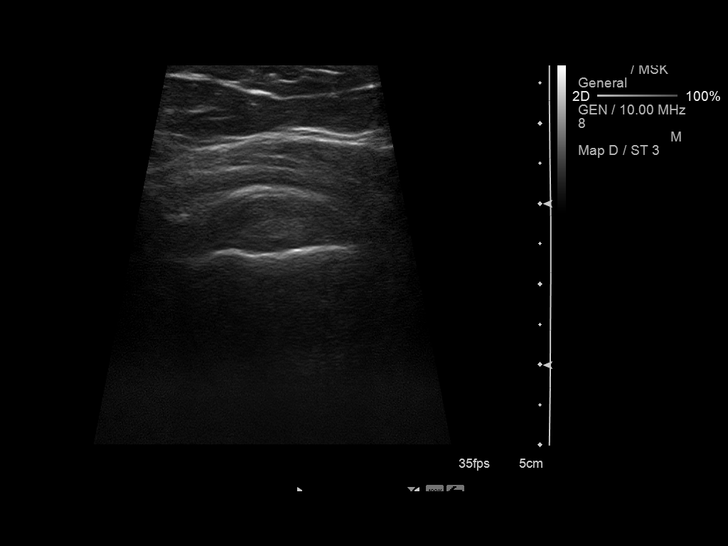
[im 11/18]
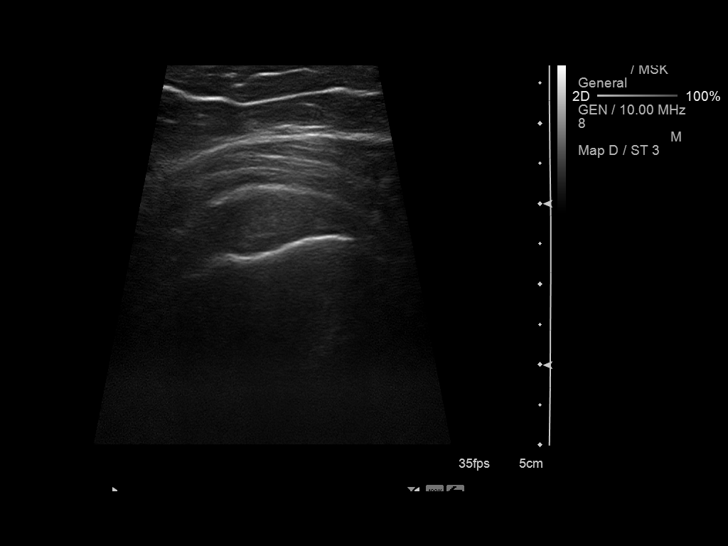
[im 13/18]
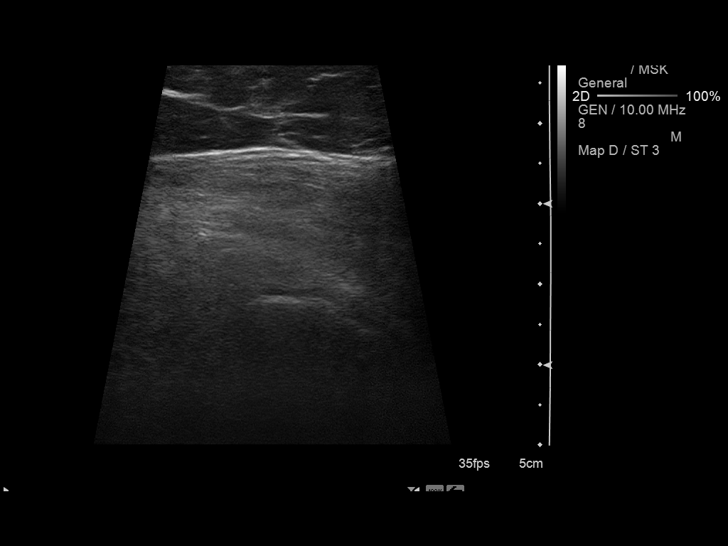
[im 14/18]
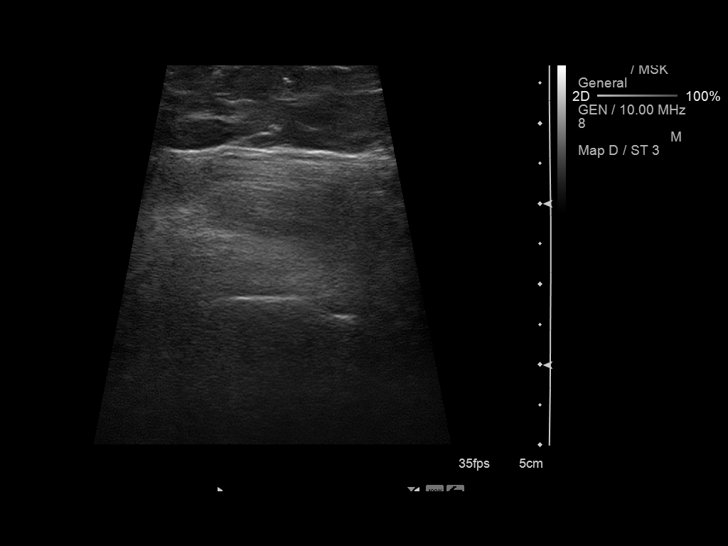
[im 15/18]
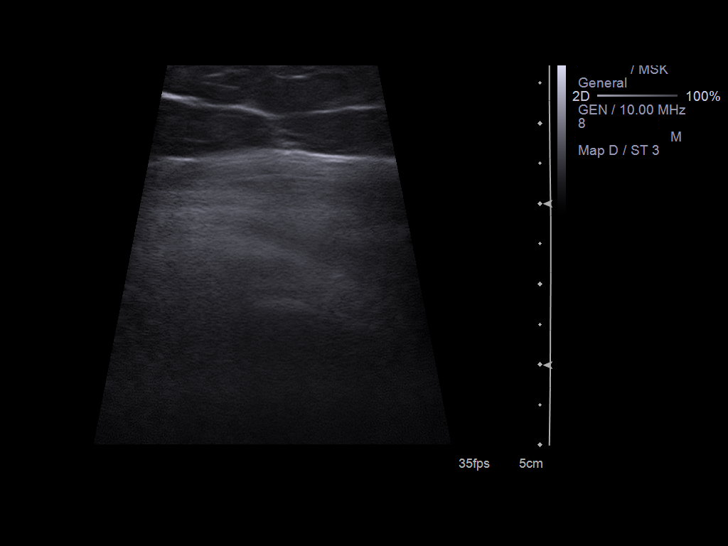
[im 17/18]
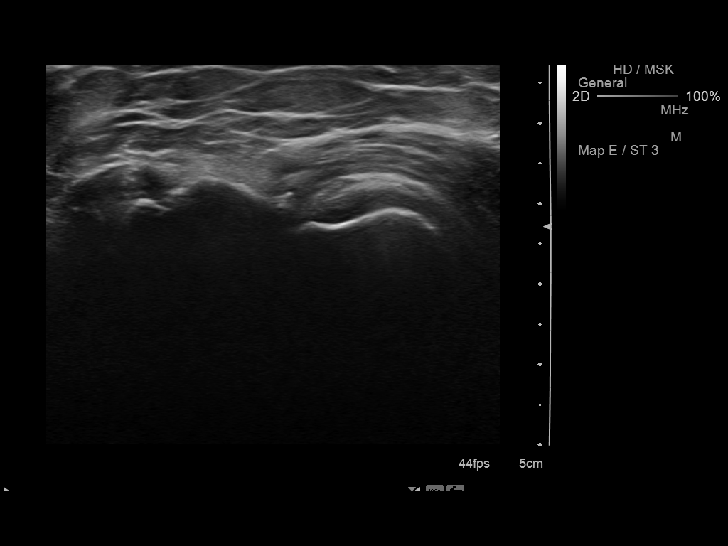
[im 18/18]
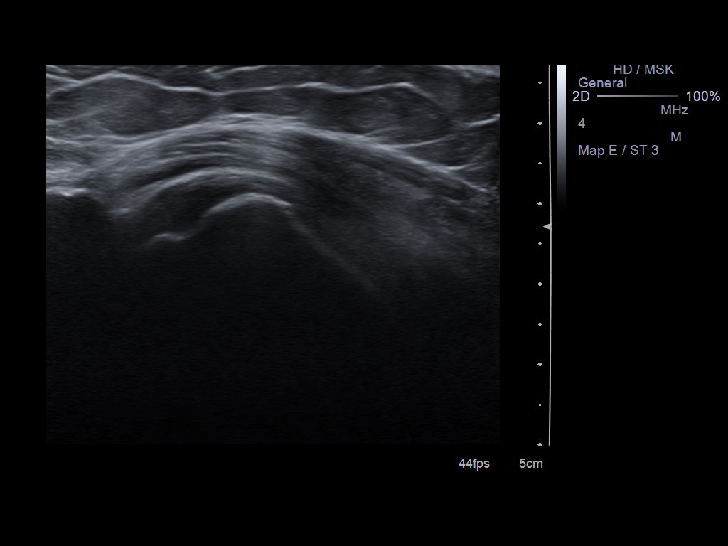

[14 of 18 positions shown; findings below may reference images not displayed]

FINDINGS: RIGHT SHOULDER:  There is a large full-thickness retracted tear of
the supraspinatus tendon.  There is a tear of the distal
subscapularis tendon.  There is an avulsion of the origin of the
long head of the biceps tendon with distal retraction.  The
bicipital groove is empty.
IMPRESSION: Large full-thickness retracted tear of the
supraspinatus.  Complete tear of the biceps tendon with distal
retraction. Tear of the distal subscapularis.

LEFT SHOULDER:

There is prominent fluid in the subdeltoid bursa.  The long head of
the biceps tendon is hypertrophied in the bicipital groove
consistent with chronic tendinosis.

The supraspinatus, infraspinatus, and subscapularis tendons are
intact.
IMPRESSION: Subdeltoid bursitis.  Chronic tendinosis and
hypertrophy of the long head of the biceps tendon.  Intact rotator
cuff.

## 2016-04-21 ENCOUNTER — Ambulatory Visit: Payer: Self-pay | Admitting: Orthopedic Surgery

## 2016-04-30 ENCOUNTER — Encounter (HOSPITAL_COMMUNITY)
Admission: RE | Admit: 2016-04-30 | Discharge: 2016-04-30 | Disposition: A | Discharge status: Home / Self Care | Payer: BC Managed Care – PPO | Source: Ambulatory Visit | Attending: Orthopedic Surgery | Admitting: Orthopedic Surgery

## 2016-04-30 ENCOUNTER — Encounter (HOSPITAL_COMMUNITY): Payer: Self-pay

## 2016-04-30 DIAGNOSIS — Z01812 Encounter for preprocedural laboratory examination: Secondary | ICD-10-CM | POA: Diagnosis present

## 2016-04-30 DIAGNOSIS — M1711 Unilateral primary osteoarthritis, right knee: Secondary | ICD-10-CM | POA: Diagnosis not present

## 2016-04-30 HISTORY — DX: Other complications of anesthesia, initial encounter: T88.59XA

## 2016-04-30 HISTORY — DX: Nausea with vomiting, unspecified: R11.2

## 2016-04-30 HISTORY — DX: Other specified postprocedural states: Z98.890

## 2016-04-30 HISTORY — DX: Adverse effect of unspecified anesthetic, initial encounter: T41.45XA

## 2016-04-30 LAB — COMPREHENSIVE METABOLIC PANEL
ALBUMIN: 4.3 g/dL (ref 3.5–5.0)
ALK PHOS: 74 U/L (ref 38–126)
ALT: 24 U/L (ref 14–54)
AST: 23 U/L (ref 15–41)
Anion gap: 8 (ref 5–15)
BILIRUBIN TOTAL: 0.6 mg/dL (ref 0.3–1.2)
BUN: 22 mg/dL — AB (ref 6–20)
CALCIUM: 9.6 mg/dL (ref 8.9–10.3)
CO2: 27 mmol/L (ref 22–32)
Chloride: 106 mmol/L (ref 101–111)
Creatinine, Ser: 0.8 mg/dL (ref 0.44–1.00)
GFR calc Af Amer: >60 mL/min (ref >60)
GLUCOSE: 91 mg/dL (ref 65–99)
POTASSIUM: 4.7 mmol/L (ref 3.5–5.1)
Sodium: 141 mmol/L (ref 135–145)
TOTAL PROTEIN: 6.9 g/dL (ref 6.5–8.1)

## 2016-04-30 LAB — URINALYSIS, ROUTINE W REFLEX MICROSCOPIC
Bilirubin Urine: NEGATIVE
Glucose, UA: NEGATIVE mg/dL
Hgb urine dipstick: NEGATIVE
Ketones, ur: NEGATIVE mg/dL
LEUKOCYTES UA: NEGATIVE
NITRITE: NEGATIVE
PH: 5 (ref 5.0–8.0)
Protein, ur: NEGATIVE mg/dL
SPECIFIC GRAVITY, URINE: 1.016 (ref 1.005–1.030)

## 2016-04-30 LAB — CBC
HEMATOCRIT: 39.3 % (ref 36.0–46.0)
HEMOGLOBIN: 13.6 g/dL (ref 12.0–15.0)
MCH: 30.2 pg (ref 26.0–34.0)
MCHC: 34.6 g/dL (ref 30.0–36.0)
MCV: 87.1 fL (ref 78.0–100.0)
Platelets: 154 10*3/uL (ref 150–400)
RBC: 4.51 MIL/uL (ref 3.87–5.11)
RDW: 13.7 % (ref 11.5–15.5)
WBC: 6.6 10*3/uL (ref 4.0–10.5)

## 2016-04-30 LAB — SURGICAL PCR SCREEN
MRSA, PCR: NEGATIVE
Staphylococcus aureus: NEGATIVE

## 2016-04-30 LAB — APTT: aPTT: 30 seconds (ref 24–37)

## 2016-04-30 LAB — PROTIME-INR
INR: 0.99 (ref 0.00–1.49)
PROTHROMBIN TIME: 13.3 s (ref 11.6–15.2)

## 2016-04-30 NOTE — Patient Instructions (Signed)
Kristin [name] MarionM Slezak  04/30/2016   Your procedure is scheduled on:  May 07, 2016  [facility]  Entrance take [place]  elevators to 3rd floor to  Short Stay Center at 6:25 AM.  Call this number if you have problems the morning of surgery [phone]   Remember: ONLY 1 PERSON MAY GO WITH YOU TO SHORT STAY TO GET  READY MORNING OF YOUR SURGERY.  Do not eat food or drink liquids :After Midnight.              Bring mask and tubing from C-pap machine morning of surgery     Take these medicines the morning of surgery with A SIP OF WATER: Levothyroxine, Prilosec, Zoloft, Valtrex DO NOT TAKE ANY DIABETIC MEDICATIONS DAY OF YOUR SURGERY                               You may not have any metal on your body including hair pins and              piercings  Do not wear jewelry, make-up, lotions, powders or perfumes, deodorant             Do not wear nail polish.  Do not shave  48 hours prior to surgery.     Do not bring valuables to the hospital. [facility] IS NOT             RESPONSIBLE   FOR VALUABLES.  Contacts, dentures or bridgework may not be worn into surgery.  Leave suitcase in the car. After surgery it may be brought to your room.        Special Instructions: coughing and deep breathing exercises, leg exercises              Please read over the following fact sheets you were given: _____________________________________________________________________             [facility] - Preparing for Surgery Before surgery, you can play an important role.  Because skin is not sterile, your skin needs to be as free of germs as possible.  You can reduce the number of germs on your skin by washing with CHG (chlorahexidine gluconate) soap before surgery.  CHG is an antiseptic cleaner which kills germs and bonds with the skin to continue killing germs even after washing. Please DO NOT use if you have an allergy to CHG or antibacterial soaps.  If your skin becomes  reddened/irritated stop using the CHG and inform your nurse when you arrive at Short Stay. Do not shave (including legs and underarms) for at least 48 hours prior to the first CHG shower.  You may shave your face/neck. Please follow these instructions carefully:  1.  Shower with CHG Soap the night before surgery and the  morning of Surgery.  2.  If you choose to wash your hair, wash your hair first as usual with your  normal  shampoo.  3.  After you shampoo, rinse your hair and body thoroughly to remove the  shampoo.                           4.  Use CHG as you would any other liquid soap.  You can apply chg directly  to the skin and wash  Gently with a scrungie or clean washcloth.  5.  Apply the CHG Soap to your body ONLY FROM THE NECK DOWN.   Do not use on face/ open                           Wound or open sores. Avoid contact with eyes, ears mouth and genitals (private parts).                       Wash face,  Genitals (private parts) with your normal soap.             6.  Wash thoroughly, paying special attention to the area where your surgery  will be performed.  7.  Thoroughly rinse your body with warm water from the neck down.  8.  DO NOT shower/wash with your normal soap after using and rinsing off  the CHG Soap.                9.  Pat yourself dry with a clean towel.            10.  Wear clean pajamas.            11.  Place clean sheets on your bed the night of your first shower and do not  sleep with pets. Day of Surgery : Do not apply any lotions/deodorants the morning of surgery.  Please wear clean clothes to the hospital/surgery center.  FAILURE TO FOLLOW THESE INSTRUCTIONS MAY RESULT IN THE CANCELLATION OF YOUR SURGERY PATIENT SIGNATURE_________________________________  NURSE SIGNATURE__________________________________  ________________________________________________________________________  WHAT IS A BLOOD TRANSFUSION? Blood Transfusion Information  A  transfusion is the replacement of blood or some of its parts. Blood is made up of multiple cells which provide different functions.  Red blood cells carry oxygen and are used for blood loss replacement.  White blood cells fight against infection.  Platelets control bleeding.  Plasma helps clot blood.  Other blood products are available for specialized needs, such as hemophilia or other clotting disorders. BEFORE THE TRANSFUSION  Who gives blood for transfusions?   Healthy volunteers who are fully evaluated to make sure their blood is safe. This is blood bank blood. Transfusion therapy is the safest it has ever been in the practice of medicine. Before blood is taken from a donor, a complete history is taken to make sure that person has no history of diseases nor engages in risky social behavior (examples are intravenous drug use or sexual activity with multiple partners). The donor's travel history is screened to minimize risk of transmitting infections, such as malaria. The donated blood is tested for signs of infectious diseases, such as HIV and hepatitis. The blood is then tested to be sure it is compatible with you in order to minimize the chance of a transfusion reaction. If you or a relative donates blood, this is often done in anticipation of surgery and is not appropriate for emergency situations. It takes many days to process the donated blood. RISKS AND COMPLICATIONS Although transfusion therapy is very safe and saves many lives, the main dangers of transfusion include:  1. Getting an infectious disease. 2. Developing a transfusion reaction. This is an allergic reaction to something in the blood you were given. Every precaution is taken to prevent this. The decision to have a blood transfusion has been considered carefully by your caregiver before blood is given. Blood is not given unless the benefits outweigh  the risks. AFTER THE TRANSFUSION  Right after receiving a blood  transfusion, you will usually feel much better and more energetic. This is especially true if your red blood cells have gotten low (anemic). The transfusion raises the level of the red blood cells which carry oxygen, and this usually causes an energy increase.  The nurse administering the transfusion will monitor you carefully for complications. HOME CARE INSTRUCTIONS  No special instructions are needed after a transfusion. You may find your energy is better. Speak with your caregiver about any limitations on activity for underlying diseases you may have. SEEK MEDICAL CARE IF:   Your condition is not improving after your transfusion.  You develop redness or irritation at the intravenous (IV) site. SEEK IMMEDIATE MEDICAL CARE IF:  Any of the following symptoms occur over the next 12 hours:  Shaking chills.  You have a temperature by mouth above 102 F (38.9 C), not controlled by medicine.  Chest, back, or muscle pain.  People around you feel you are not acting correctly or are confused.  Shortness of breath or difficulty breathing.  Dizziness and fainting.  You get a rash or develop hives.  You have a decrease in urine output.  Your urine turns a dark color or changes to pink, red, or brown. Any of the following symptoms occur over the next 10 days:  You have a temperature by mouth above 102 F (38.9 C), not controlled by medicine.  Shortness of breath.  Weakness after normal activity.  The white part of the eye turns yellow (jaundice).  You have a decrease in the amount of urine or are urinating less often.  Your urine turns a dark color or changes to pink, red, or brown. Document Released: 11/02/2000 Document Revised: 01/28/2012 Document Reviewed: 06/21/2008 ExitCare Patient Information 2014 [place].  _______________________________________________________________________  Incentive Spirometer  An incentive spirometer is a tool that can help keep your  lungs clear and active. This tool measures how well you are filling your lungs with each breath. Taking long deep breaths may help reverse or decrease the chance of developing breathing (pulmonary) problems (especially infection) following:  A long period of time when you are unable to move or be active. BEFORE THE PROCEDURE   If the spirometer includes an indicator to show your best effort, your nurse or respiratory therapist will set it to a desired goal.  If possible, sit up straight or lean slightly forward. Try not to slouch.  Hold the incentive spirometer in an upright position. INSTRUCTIONS FOR USE  3. Sit on the edge of your bed if possible, or sit up as far as you can in bed or on a chair. 4. Hold the incentive spirometer in an upright position. 5. Breathe out normally. 6. Place the mouthpiece in your mouth and seal your lips tightly around it. 7. Breathe in slowly and as deeply as possible, raising the piston or the ball toward the top of the column. 8. Hold your breath for 3-5 seconds or for as long as possible. Allow the piston or ball to fall to the bottom of the column. 9. Remove the mouthpiece from your mouth and breathe out normally. 10. Rest for a few seconds and repeat Steps 1 through 7 at least 10 times every 1-2 hours when you are awake. Take your time and take a few normal breaths between deep breaths. 11. The spirometer may include an indicator to show your best effort. Use the indicator as a goal to work  toward during each repetition. 12. After each set of 10 deep breaths, practice coughing to be sure your lungs are clear. If you have an incision (the cut made at the time of surgery), support your incision when coughing by placing a pillow or rolled up towels firmly against it. Once you are able to get out of bed, walk around indoors and cough well. You may stop using the incentive spirometer when instructed by your caregiver.  RISKS AND COMPLICATIONS  Take your time so  you do not get dizzy or light-headed.  If you are in pain, you may need to take or ask for pain medication before doing incentive spirometry. It is harder to take a deep breath if you are having pain. AFTER USE  Rest and breathe slowly and easily.  It can be helpful to keep track of a log of your progress. Your caregiver can provide you with a simple table to help with this. If you are using the spirometer at home, follow these instructions: [place] IF:   You are having difficultly using the spirometer.  You have trouble using the spirometer as often as instructed.  Your pain medication is not giving enough relief while using the spirometer.  You develop fever of 100.5 F (38.1 C) or higher. SEEK IMMEDIATE MEDICAL CARE IF:   You cough up bloody sputum that had not been present before.  You develop fever of 102 F (38.9 C) or greater.  You develop worsening pain at or near the incision site. MAKE SURE YOU:   Understand these instructions.  Will watch your condition.  Will get help right away if you are not doing well or get worse. Document Released: 03/18/2007 Document Revised: 01/28/2012 Document Reviewed: 05/19/2007 [facility] Patient Information 2014 [place], [place].   ________________________________________________________________________

## 2016-04-30 NOTE — Progress Notes (Signed)
08-26-2009 - Sleep study - in chart 02-13-16 - LOV - Dr. Lorin PicketScott (fam.med.) - in chart 02-13-16 - Surgical clearance - Dr. Lorin PicketScott - in chart 02-13-16 - CBC w/diff, CMP & EKG - in chart

## 2016-05-06 ENCOUNTER — Ambulatory Visit: Payer: Self-pay | Admitting: Orthopedic Surgery

## 2016-05-06 NOTE — H&P (Signed)
Kristin [name] DOB: 08/07/1956 Married / Language: English / Race: White Female Date of Admission:  05/07/2016 CC:  Right Knee Pain History of Present Illness The patient is a 60 year old female who comes in for a preoperative History and Physical. The patient is scheduled for a right total knee arthroplasty to be performed by Dr. Gus RankinFrank V. Aluisio, MD at [facility] on 05-07-2016. The patient is a 60 year old female who presented with knee complaints. The patient was seen for a second opinion. The patient reports right knee symptoms including: pain, swelling, instability, weakness, stiffness and soreness which began 8 month(s) ago without any known injury.The patient feels that the symptoms are worsening. The patient has the current diagnosis of knee osteoarthritis. Prior to being seen, the patient was previously evaluated by a colleague (Dr. [name], as well as her pcp). Previous work-up for this problem has included knee x-rays. Past treatment for this problem has included application of ice, intra-articular injection of corticosteroids (in December, did not help) and physical therapy. The patient reports that symptoms radiate to the right hip, right thigh and right lower leg. Current treatment includes nonsteroidal anti-inflammatory drugs (Aleve, prn). She states that her right knee has gotten progressively worse over time. It is bothering for quite a while, but gotten much worse in the last eight months. It limits what she can and cannot do. She had cortisone injection and Synvisc One with not any benefit. It hurts day and night. She does not get swelling. It is not giving out on her. She does not have any problems with her left knee currently. She denies having any hip pain, lower extremity weakness or paresthesias. She is ready to proceed with surgery at this time. They have been treated conservatively in the past for the above stated problem and despite conservative measures, they continue to  have progressive pain and severe functional limitations and dysfunction. They have failed non-operative management including home exercise, medications, and injections. It is felt that they would benefit from undergoing total joint replacement. Risks and benefits of the procedure have been discussed with the patient and they elect to proceed with surgery. There are no active contraindications to surgery such as ongoing infection or rapidly progressive neurological disease.   Problem List/Past Medical  Primary osteoarthritis of right knee (M17.11)  Chronic Pain  Depression  Gastroesophageal Reflux Disease  Hypopituitarism  Hypothyroidism  Sleep Apnea  uses CPAP Hemorrhoids  Bronchitis  Past History Migraine Headache  Remote History Menopause  Non-alcoholic Fatty Liver Disease  Hepatic Hemangioma  via MRI Scan Moderate Spleenomegaly via MRI  Thrombocytopenia  Allergies Dilaudid *ANALGESICS - OPIOID*  Nausea, Rash.  Family History Cancer  father and grandmother fathers side Cerebrovascular Accident  father Chronic Obstructive Lung Disease  father Depression  mother Diabetes Mellitus  mother Heart Disease  grandfather fathers side Hypertension  mother Osteoarthritis  mother  Social History Alcohol use  never consumed alcohol Children  2 Current work status  working full time Drug/Alcohol Rehab (Currently)  no Drug/Alcohol Rehab (Previously)  no Exercise  Exercises daily; does other Illicit drug use  no Living situation  live with spouse Marital status  married Number of flights of stairs before winded  2-3 Pain Contract  no Tobacco / smoke exposure  no Tobacco use  never smoker Insurance claims handlerAdvance Directives  Living Will, Healthcare POA Post-Surgical Plans  HOME  Medication History ValACYclovir HCl (1GM Tablet, 1/2 Oral daily) Active. Sertraline HCl (100MG  Tablet, 1 1/2 Oral daily)  Active. Omeprazole (20MG  Capsule DR, Oral two times daily)  Active. Levothyroxine Sodium ( Tablet, Oral) Active. Aleve (220MG  Tablet, Oral as needed) Active. Iron Support (27mg  Oral) Active. Calcium + D (630+400 Oral) Specific strength unknown - Active.   Past Surgical History  Tonsillectomy  Date: 1964. Carpal Tunnel Repair  bilateral: Right - 2001, Left - 2007 Colonoscopy, Endoscopy  Date: 03/2007. Hysterectomy  Date: 09/2009. partial (non-cancerous), Bladder Tack Procedure Anal Fissure Repair  Date: 2013. Rotator Cuff Repair  Date: 03/2013. Bunion surgery  Date: 08/2014. Endoscopy  Date: 2015.  Review of Systems  General Not Present- Chills, Fatigue, Fever, Memory Loss, Night Sweats, Weight Gain and Weight Loss. Skin Not Present- Eczema, Hives, Itching, Lesions and Rash. HEENT Not Present- Dentures, Double Vision, Headache, Hearing Loss, Tinnitus and Visual Loss. Respiratory Not Present- Allergies, Chronic Cough, Coughing up blood, Shortness of breath at rest and Shortness of breath with exertion. Cardiovascular Not Present- Chest Pain, Difficulty Breathing Lying Down, Murmur, Palpitations, Racing/skipping heartbeats and Swelling. Gastrointestinal Not Present- Abdominal Pain, Bloody Stool, Constipation, Diarrhea, Difficulty Swallowing, Heartburn, Jaundice, Loss of appetitie, Nausea and Vomiting. Female Genitourinary Not Present- Blood in Urine, Discharge, Flank Pain, Incontinence, Painful Urination, Urgency, Urinary frequency, Urinary Retention, Urinating at Night and Weak urinary stream. Musculoskeletal Present- Back Pain, Joint Pain, Joint Swelling, Morning Stiffness, Muscle Pain and Muscle Weakness. Not Present- Spasms. Neurological Not Present- Blackout spells, Difficulty with balance, Dizziness, Paralysis, Tremor and Weakness. Psychiatric Not Present- Insomnia.  Vitals Weight: 197 lb Height: 61.5in Weight was reported by patient. Height was reported by patient. Body Surface Area: 1.89 m Body Mass Index: 36.62  kg/m  Pulse: 76 (Regular)  BP: 142/88 (Sitting, Left Arm, Standard)   Physical Exam  General Mental Status -Alert, cooperative and good historian. General Appearance-pleasant, Not in acute distress. Orientation-Oriented X3. Build & Nutrition-Well nourished and Well developed.  Head and Neck Head-normocephalic, atraumatic . Neck Global Assessment - supple, no bruit auscultated on the right, no bruit auscultated on the left.  Eye Pupil - Bilateral-Regular and Round. Motion - Bilateral-EOMI.  Chest and Lung Exam Auscultation Breath sounds - clear at anterior chest wall and clear at posterior chest wall. Adventitious sounds - No Adventitious sounds.  Cardiovascular Auscultation Rhythm - Regular rate and rhythm. Heart Sounds - S1 WNL and S2 WNL. Murmurs & Other Heart Sounds - Auscultation of the heart reveals - No Murmurs.  Abdomen Palpation/Percussion Tenderness - Abdomen is non-tender to palpation. Rigidity (guarding) - Abdomen is soft. Auscultation Auscultation of the abdomen reveals - Bowel sounds normal.  Female Genitourinary Note: Not done, not pertinent to present illness   Musculoskeletal Note: A well developed female in no distress. Her hips show normal range of motion with no discomfort. Left knee, no effusion. Range of motion is about 0 to 125. No tenderness or instability. Right knee, slight varus. Range 5 to 120. Marked crepitus on range of motion. Tenderness medial greater than lateral. No instability. Pulse, sensation and motor are intact in both lower extremities.  RADIOGRAPHS AP of both knees and lateral of both show that the left knee has minimal degenerative change. Right knee is bone on bone medial and patellofemoral.  Assessment & Plan Primary osteoarthritis of right knee (M17.11)  Note:Surgical Plans: Right Total Knee Replacement  Disposition: Home  PCP: Dr. Lucila Maine - Patient has been seen preoperatively and felt to be  stable for surgery. "No specific concerns and see note and EKG."  IV TXA  Anesthesia Issues: None  VERITAS STUDY PATIENT  Virtual PT @ Home - No Home Health  Signed electronically by Beckey Rutter, III PA-C

## 2016-05-07 ENCOUNTER — Inpatient Hospital Stay (HOSPITAL_COMMUNITY): Payer: BC Managed Care – PPO | Admitting: Registered Nurse

## 2016-05-07 ENCOUNTER — Inpatient Hospital Stay (HOSPITAL_COMMUNITY)
Admission: RE | Admit: 2016-05-07 | Discharge: 2016-05-09 | DRG: 470 | Disposition: A | Discharge status: Home Health | Payer: BC Managed Care – PPO | Source: Ambulatory Visit | Attending: Orthopedic Surgery | Admitting: Orthopedic Surgery

## 2016-05-07 ENCOUNTER — Encounter (HOSPITAL_COMMUNITY)
Admission: RE | Disposition: A | Discharge status: Home Health | Payer: Self-pay | Source: Ambulatory Visit | Attending: Orthopedic Surgery

## 2016-05-07 ENCOUNTER — Encounter (HOSPITAL_COMMUNITY): Payer: Self-pay | Admitting: *Deleted

## 2016-05-07 DIAGNOSIS — E039 Hypothyroidism, unspecified: Secondary | ICD-10-CM | POA: Diagnosis present

## 2016-05-07 DIAGNOSIS — Z809 Family history of malignant neoplasm, unspecified: Secondary | ICD-10-CM

## 2016-05-07 DIAGNOSIS — M179 Osteoarthritis of knee, unspecified: Secondary | ICD-10-CM | POA: Diagnosis present

## 2016-05-07 DIAGNOSIS — Z823 Family history of stroke: Secondary | ICD-10-CM

## 2016-05-07 DIAGNOSIS — M1711 Unilateral primary osteoarthritis, right knee: Principal | ICD-10-CM | POA: Diagnosis present

## 2016-05-07 DIAGNOSIS — Z8249 Family history of ischemic heart disease and other diseases of the circulatory system: Secondary | ICD-10-CM

## 2016-05-07 DIAGNOSIS — Z833 Family history of diabetes mellitus: Secondary | ICD-10-CM

## 2016-05-07 DIAGNOSIS — M1710 Unilateral primary osteoarthritis, unspecified knee: Secondary | ICD-10-CM | POA: Diagnosis present

## 2016-05-07 DIAGNOSIS — K760 Fatty (change of) liver, not elsewhere classified: Secondary | ICD-10-CM | POA: Diagnosis present

## 2016-05-07 DIAGNOSIS — K219 Gastro-esophageal reflux disease without esophagitis: Secondary | ICD-10-CM | POA: Diagnosis present

## 2016-05-07 DIAGNOSIS — G8929 Other chronic pain: Secondary | ICD-10-CM | POA: Diagnosis present

## 2016-05-07 DIAGNOSIS — Z825 Family history of asthma and other chronic lower respiratory diseases: Secondary | ICD-10-CM | POA: Diagnosis not present

## 2016-05-07 DIAGNOSIS — G4730 Sleep apnea, unspecified: Secondary | ICD-10-CM | POA: Diagnosis present

## 2016-05-07 DIAGNOSIS — H9190 Unspecified hearing loss, unspecified ear: Secondary | ICD-10-CM | POA: Diagnosis present

## 2016-05-07 DIAGNOSIS — M25561 Pain in right knee: Secondary | ICD-10-CM | POA: Diagnosis present

## 2016-05-07 DIAGNOSIS — F329 Major depressive disorder, single episode, unspecified: Secondary | ICD-10-CM | POA: Diagnosis present

## 2016-05-07 DIAGNOSIS — Z818 Family history of other mental and behavioral disorders: Secondary | ICD-10-CM

## 2016-05-07 HISTORY — PX: TOTAL KNEE ARTHROPLASTY: SHX125

## 2016-05-07 LAB — TYPE AND SCREEN
ABO/RH(D): O NEG
ANTIBODY SCREEN: NEGATIVE

## 2016-05-07 SURGERY — ARTHROPLASTY, KNEE, TOTAL
Anesthesia: Spinal | Site: Knee | Laterality: Right

## 2016-05-07 MED ORDER — PROPOFOL 500 MG/50ML IV EMUL
INTRAVENOUS | Status: DC | PRN
  Administered 2016-05-07: 40 ug/kg/min via INTRAVENOUS

## 2016-05-07 MED ORDER — OXYCODONE HCL 5 MG PO TABS: 5.0000 mg | ORAL_TABLET | ORAL | Status: DC | PRN

## 2016-05-07 MED ORDER — POLYETHYLENE GLYCOL 3350 17 G PO PACK: 17.0000 g | PACK | Freq: Every day | ORAL | Status: DC | PRN

## 2016-05-07 MED ORDER — ACETAMINOPHEN 10 MG/ML IV SOLN
1000.0000 mg | Freq: Once | INTRAVENOUS | Status: AC
  Administered 2016-05-07: 1000 mg via INTRAVENOUS
  Filled 2016-05-07: qty 100

## 2016-05-07 MED ORDER — LACTATED RINGERS IV SOLN
INTRAVENOUS | Status: DC
  Administered 2016-05-07: 1000 mL via INTRAVENOUS

## 2016-05-07 MED ORDER — DEXAMETHASONE SODIUM PHOSPHATE 10 MG/ML IJ SOLN
10.0000 mg | Freq: Once | INTRAMUSCULAR | Status: AC
  Administered 2016-05-07: 10 mg via INTRAVENOUS

## 2016-05-07 MED ORDER — RIVAROXABAN 10 MG PO TABS
10.0000 mg | ORAL_TABLET | Freq: Every day | ORAL | Status: DC
  Administered 2016-05-08 – 2016-05-09 (×2): 10 mg via ORAL
  Filled 2016-05-07 (×2): qty 1

## 2016-05-07 MED ORDER — TRANEXAMIC ACID 1000 MG/10ML IV SOLN
1000.0000 mg | INTRAVENOUS | Status: AC
  Administered 2016-05-07: 1000 mg via INTRAVENOUS
  Filled 2016-05-07: qty 10

## 2016-05-07 MED ORDER — SERTRALINE HCL 50 MG PO TABS
150.0000 mg | ORAL_TABLET | Freq: Every day | ORAL | Status: DC
  Administered 2016-05-07 – 2016-05-09 (×3): 150 mg via ORAL
  Filled 2016-05-07 (×3): qty 3

## 2016-05-07 MED ORDER — BISACODYL 10 MG RE SUPP: 10.0000 mg | Freq: Every day | RECTAL | Status: DC | PRN

## 2016-05-07 MED ORDER — BUPIVACAINE LIPOSOME 1.3 % IJ SUSP
INTRAMUSCULAR | Status: DC | PRN
  Administered 2016-05-07: 20 mL

## 2016-05-07 MED ORDER — ACETAMINOPHEN 325 MG PO TABS: 650.0000 mg | ORAL_TABLET | Freq: Four times a day (QID) | ORAL | Status: DC | PRN

## 2016-05-07 MED ORDER — PANTOPRAZOLE SODIUM 40 MG PO TBEC
40.0000 mg | DELAYED_RELEASE_TABLET | Freq: Two times a day (BID) | ORAL | Status: DC
  Administered 2016-05-07 – 2016-05-09 (×4): 40 mg via ORAL
  Filled 2016-05-07 (×5): qty 1

## 2016-05-07 MED ORDER — FENTANYL CITRATE (PF) 100 MCG/2ML IJ SOLN
INTRAMUSCULAR | Status: AC
  Filled 2016-05-07: qty 2

## 2016-05-07 MED ORDER — METHOCARBAMOL 500 MG PO TABS
500.0000 mg | ORAL_TABLET | Freq: Four times a day (QID) | ORAL | Status: DC | PRN
  Administered 2016-05-07 – 2016-05-09 (×6): 500 mg via ORAL
  Filled 2016-05-07 (×6): qty 1

## 2016-05-07 MED ORDER — TRAMADOL HCL 50 MG PO TABS: 50.0000 mg | ORAL_TABLET | Freq: Four times a day (QID) | ORAL | Status: DC | PRN

## 2016-05-07 MED ORDER — LEVOTHYROXINE SODIUM 75 MCG PO TABS
150.0000 ug | ORAL_TABLET | Freq: Every day | ORAL | Status: DC
  Administered 2016-05-08 – 2016-05-09 (×2): 150 ug via ORAL
  Filled 2016-05-07 (×3): qty 2

## 2016-05-07 MED ORDER — FENTANYL CITRATE (PF) 100 MCG/2ML IJ SOLN: 25.0000 ug | INTRAMUSCULAR | Status: DC | PRN

## 2016-05-07 MED ORDER — METOCLOPRAMIDE HCL 5 MG PO TABS: 5.0000 mg | ORAL_TABLET | Freq: Three times a day (TID) | ORAL | Status: DC | PRN

## 2016-05-07 MED ORDER — SODIUM CHLORIDE 0.9 % IJ SOLN
INTRAMUSCULAR | Status: DC | PRN
  Administered 2016-05-07: 30 mL

## 2016-05-07 MED ORDER — CHLORHEXIDINE GLUCONATE 4 % EX LIQD: 60.0000 mL | Freq: Once | CUTANEOUS | Status: DC

## 2016-05-07 MED ORDER — SODIUM CHLORIDE 0.9 % IR SOLN
Status: DC | PRN
  Administered 2016-05-07: 1000 mL

## 2016-05-07 MED ORDER — MENTHOL 3 MG MT LOZG: 1.0000 | LOZENGE | OROMUCOSAL | Status: DC | PRN

## 2016-05-07 MED ORDER — ONDANSETRON HCL 4 MG/2ML IJ SOLN
INTRAMUSCULAR | Status: DC | PRN
  Administered 2016-05-07: 4 mg via INTRAVENOUS

## 2016-05-07 MED ORDER — METHOCARBAMOL 1000 MG/10ML IJ SOLN
500.0000 mg | Freq: Four times a day (QID) | INTRAMUSCULAR | Status: DC | PRN
  Administered 2016-05-07: 500 mg via INTRAVENOUS
  Filled 2016-05-07: qty 550
  Filled 2016-05-07: qty 5

## 2016-05-07 MED ORDER — BUPIVACAINE HCL (PF) 0.25 % IJ SOLN
INTRAMUSCULAR | Status: AC
Start: 2016-05-07 — End: 2016-05-07
  Filled 2016-05-07: qty 30

## 2016-05-07 MED ORDER — ACETAMINOPHEN 650 MG RE SUPP: 650.0000 mg | Freq: Four times a day (QID) | RECTAL | Status: DC | PRN

## 2016-05-07 MED ORDER — TRANEXAMIC ACID 1000 MG/10ML IV SOLN
1000.0000 mg | Freq: Once | INTRAVENOUS | Status: AC
  Administered 2016-05-07: 1000 mg via INTRAVENOUS
  Filled 2016-05-07: qty 10

## 2016-05-07 MED ORDER — BUPIVACAINE HCL 0.25 % IJ SOLN
INTRAMUSCULAR | Status: DC | PRN
  Administered 2016-05-07: 20 mL

## 2016-05-07 MED ORDER — MIDAZOLAM HCL 5 MG/5ML IJ SOLN
INTRAMUSCULAR | Status: DC | PRN
  Administered 2016-05-07: 2 mg via INTRAVENOUS

## 2016-05-07 MED ORDER — RIVAROXABAN 10 MG PO TABS: 10.0000 mg | ORAL_TABLET | Freq: Every day | ORAL | Status: DC

## 2016-05-07 MED ORDER — DOCUSATE SODIUM 100 MG PO CAPS
100.0000 mg | ORAL_CAPSULE | Freq: Two times a day (BID) | ORAL | Status: DC
  Administered 2016-05-07 – 2016-05-08 (×3): 100 mg via ORAL
  Filled 2016-05-07 (×4): qty 1

## 2016-05-07 MED ORDER — PHENOL 1.4 % MT LIQD
1.0000 | OROMUCOSAL | Status: DC | PRN
  Filled 2016-05-07: qty 177

## 2016-05-07 MED ORDER — SODIUM CHLORIDE 0.9 % IV SOLN
INTRAVENOUS | Status: DC
  Administered 2016-05-07: 13:00:00 via INTRAVENOUS
  Administered 2016-05-08: 75 mL/h via INTRAVENOUS

## 2016-05-07 MED ORDER — METHOCARBAMOL 500 MG PO TABS: 500.0000 mg | ORAL_TABLET | Freq: Four times a day (QID) | ORAL | Status: DC | PRN

## 2016-05-07 MED ORDER — FLEET ENEMA 7-19 GM/118ML RE ENEM: 1.0000 | ENEMA | Freq: Once | RECTAL | Status: DC | PRN

## 2016-05-07 MED ORDER — MEPERIDINE HCL 50 MG/ML IJ SOLN: 6.2500 mg | INTRAMUSCULAR | Status: DC | PRN

## 2016-05-07 MED ORDER — DEXAMETHASONE SODIUM PHOSPHATE 10 MG/ML IJ SOLN
10.0000 mg | Freq: Once | INTRAMUSCULAR | Status: AC
  Administered 2016-05-08: 10 mg via INTRAVENOUS
  Filled 2016-05-07: qty 1

## 2016-05-07 MED ORDER — METOCLOPRAMIDE HCL 5 MG/ML IJ SOLN: 10.0000 mg | Freq: Once | INTRAMUSCULAR | Status: DC | PRN

## 2016-05-07 MED ORDER — MIDAZOLAM HCL 2 MG/2ML IJ SOLN
INTRAMUSCULAR | Status: AC
  Filled 2016-05-07: qty 2

## 2016-05-07 MED ORDER — OXYCODONE HCL 5 MG PO TABS
5.0000 mg | ORAL_TABLET | ORAL | Status: DC | PRN
  Administered 2016-05-07: 10 mg via ORAL
  Administered 2016-05-07 (×2): 5 mg via ORAL
  Administered 2016-05-08 – 2016-05-09 (×10): 10 mg via ORAL
  Filled 2016-05-07 (×2): qty 2
  Filled 2016-05-07 (×2): qty 1
  Filled 2016-05-07 (×9): qty 2

## 2016-05-07 MED ORDER — METOCLOPRAMIDE HCL 5 MG/ML IJ SOLN: 5.0000 mg | Freq: Three times a day (TID) | INTRAMUSCULAR | Status: DC | PRN

## 2016-05-07 MED ORDER — ACETAMINOPHEN 500 MG PO TABS
1000.0000 mg | ORAL_TABLET | Freq: Four times a day (QID) | ORAL | Status: AC
  Administered 2016-05-07 – 2016-05-08 (×4): 1000 mg via ORAL
  Filled 2016-05-07 (×3): qty 2

## 2016-05-07 MED ORDER — PROPOFOL 10 MG/ML IV BOLUS
INTRAVENOUS | Status: AC
Start: 2016-05-07 — End: 2016-05-07
  Filled 2016-05-07: qty 40

## 2016-05-07 MED ORDER — FENTANYL CITRATE (PF) 100 MCG/2ML IJ SOLN
INTRAMUSCULAR | Status: DC | PRN
  Administered 2016-05-07: 50 ug via INTRAVENOUS

## 2016-05-07 MED ORDER — BUPIVACAINE LIPOSOME 1.3 % IJ SUSP
20.0000 mL | Freq: Once | INTRAMUSCULAR | Status: DC
  Filled 2016-05-07: qty 20

## 2016-05-07 MED ORDER — LIDOCAINE HCL (CARDIAC) 20 MG/ML IV SOLN
INTRAVENOUS | Status: AC
  Filled 2016-05-07: qty 5

## 2016-05-07 MED ORDER — CEFAZOLIN SODIUM-DEXTROSE 2-4 GM/100ML-% IV SOLN
2.0000 g | INTRAVENOUS | Status: AC
  Administered 2016-05-07: 2 g via INTRAVENOUS
  Filled 2016-05-07: qty 100

## 2016-05-07 MED ORDER — LACTATED RINGERS IV SOLN: INTRAVENOUS | Status: DC

## 2016-05-07 MED ORDER — CEFAZOLIN SODIUM-DEXTROSE 2-4 GM/100ML-% IV SOLN
INTRAVENOUS | Status: AC
  Filled 2016-05-07: qty 100

## 2016-05-07 MED ORDER — DIPHENHYDRAMINE HCL 12.5 MG/5ML PO ELIX: 12.5000 mg | ORAL_SOLUTION | ORAL | Status: DC | PRN

## 2016-05-07 MED ORDER — ONDANSETRON HCL 4 MG PO TABS: 4.0000 mg | ORAL_TABLET | Freq: Four times a day (QID) | ORAL | Status: DC | PRN

## 2016-05-07 MED ORDER — MORPHINE SULFATE (PF) 2 MG/ML IV SOLN: 1.0000 mg | INTRAVENOUS | Status: DC | PRN

## 2016-05-07 MED ORDER — ONDANSETRON HCL 4 MG/2ML IJ SOLN: 4.0000 mg | Freq: Four times a day (QID) | INTRAMUSCULAR | Status: DC | PRN

## 2016-05-07 MED ORDER — SODIUM CHLORIDE 0.9 % IJ SOLN
INTRAMUSCULAR | Status: AC
  Filled 2016-05-07: qty 50

## 2016-05-07 MED ORDER — CEFAZOLIN SODIUM-DEXTROSE 2-4 GM/100ML-% IV SOLN
2.0000 g | Freq: Four times a day (QID) | INTRAVENOUS | Status: AC
  Administered 2016-05-07 (×2): 2 g via INTRAVENOUS
  Filled 2016-05-07: qty 100

## 2016-05-07 MED ORDER — LIDOCAINE HCL (CARDIAC) 20 MG/ML IV SOLN
INTRAVENOUS | Status: DC | PRN
  Administered 2016-05-07: 100 mg via INTRAVENOUS

## 2016-05-07 MED ORDER — ACETAMINOPHEN 10 MG/ML IV SOLN
INTRAVENOUS | Status: AC
  Filled 2016-05-07: qty 100

## 2016-05-07 MED ORDER — BUPIVACAINE IN DEXTROSE 0.75-8.25 % IT SOLN
INTRATHECAL | Status: DC | PRN
  Administered 2016-05-07: 1.4 mL via INTRATHECAL

## 2016-05-07 SURGICAL SUPPLY — 52 items
BAG DECANTER FOR FLEXI CONT (MISCELLANEOUS) ×3 IMPLANT
BAG ZIPLOCK 12X15 (MISCELLANEOUS) ×3 IMPLANT
BANDAGE ACE 6X5 VEL STRL LF (GAUZE/BANDAGES/DRESSINGS) IMPLANT
BANDAGE ELASTIC 6 VELCRO ST LF (GAUZE/BANDAGES/DRESSINGS) ×3 IMPLANT
BLADE SAG 18X100X1.27 (BLADE) ×3 IMPLANT
BLADE SAW SGTL 11.0X1.19X90.0M (BLADE) ×3 IMPLANT
BOWL SMART MIX CTS (DISPOSABLE) ×3 IMPLANT
CAPT KNEE TOTAL 3 ATTUNE ×3 IMPLANT
CEMENT HV SMART SET (Cement) ×6 IMPLANT
CLOSURE WOUND 1/2 X4 (GAUZE/BANDAGES/DRESSINGS) ×1
CLOTH BEACON ORANGE TIMEOUT ST (SAFETY) ×3 IMPLANT
CUFF TOURN SGL QUICK 34 (TOURNIQUET CUFF) ×2
CUFF TRNQT CYL 34X4X40X1 (TOURNIQUET CUFF) ×1 IMPLANT
DECANTER SPIKE VIAL GLASS SM (MISCELLANEOUS) ×3 IMPLANT
DRAPE U-SHAPE 47X51 STRL (DRAPES) ×3 IMPLANT
DRSG ADAPTIC 3X8 NADH LF (GAUZE/BANDAGES/DRESSINGS) ×3 IMPLANT
DRSG PAD ABDOMINAL 8X10 ST (GAUZE/BANDAGES/DRESSINGS) IMPLANT
DURAPREP 26ML APPLICATOR (WOUND CARE) ×3 IMPLANT
ELECT REM PT RETURN 9FT ADLT (ELECTROSURGICAL) ×3
ELECTRODE REM PT RTRN 9FT ADLT (ELECTROSURGICAL) ×1 IMPLANT
EVACUATOR 1/8 PVC DRAIN (DRAIN) ×3 IMPLANT
GAUZE SPONGE 4X4 12PLY STRL (GAUZE/BANDAGES/DRESSINGS) ×3 IMPLANT
GLOVE BIO SURGEON STRL SZ7.5 (GLOVE) IMPLANT
GLOVE BIO SURGEON STRL SZ8 (GLOVE) ×3 IMPLANT
GLOVE BIOGEL PI IND STRL 6.5 (GLOVE) IMPLANT
GLOVE BIOGEL PI IND STRL 8 (GLOVE) ×1 IMPLANT
GLOVE BIOGEL PI INDICATOR 6.5 (GLOVE)
GLOVE BIOGEL PI INDICATOR 8 (GLOVE) ×2
GLOVE SURG SS PI 6.5 STRL IVOR (GLOVE) IMPLANT
GOWN STRL REUS W/TWL LRG LVL3 (GOWN DISPOSABLE) ×3 IMPLANT
GOWN STRL REUS W/TWL XL LVL3 (GOWN DISPOSABLE) IMPLANT
HANDPIECE INTERPULSE COAX TIP (DISPOSABLE) ×2
IMMOBILIZER KNEE 20 (SOFTGOODS) ×3 IMPLANT
IMMOBILIZER KNEE 20 THIGH 36 (SOFTGOODS) IMPLANT
MANIFOLD NEPTUNE II (INSTRUMENTS) ×3 IMPLANT
NS IRRIG 1000ML POUR BTL (IV SOLUTION) ×3 IMPLANT
PACK TOTAL KNEE CUSTOM (KITS) ×3 IMPLANT
PAD ABD 8X10 STRL (GAUZE/BANDAGES/DRESSINGS) ×3 IMPLANT
PADDING CAST COTTON 6X4 STRL (CAST SUPPLIES) ×3 IMPLANT
POSITIONER SURGICAL ARM (MISCELLANEOUS) ×3 IMPLANT
SET HNDPC FAN SPRY TIP SCT (DISPOSABLE) ×1 IMPLANT
STRIP CLOSURE SKIN 1/2X4 (GAUZE/BANDAGES/DRESSINGS) ×2 IMPLANT
SUT MNCRL AB 4-0 PS2 18 (SUTURE) ×3 IMPLANT
SUT VIC AB 2-0 CT1 27 (SUTURE) ×6
SUT VIC AB 2-0 CT1 TAPERPNT 27 (SUTURE) ×3 IMPLANT
SUT VLOC 180 0 24IN GS25 (SUTURE) ×3 IMPLANT
SYR 50ML LL SCALE MARK (SYRINGE) ×3 IMPLANT
TRAY FOLEY W/METER SILVER 14FR (SET/KITS/TRAYS/PACK) ×3 IMPLANT
TRAY FOLEY W/METER SILVER 16FR (SET/KITS/TRAYS/PACK) IMPLANT
WATER STERILE IRR 1500ML POUR (IV SOLUTION) ×3 IMPLANT
WRAP KNEE MAXI GEL POST OP (GAUZE/BANDAGES/DRESSINGS) ×3 IMPLANT
YANKAUER SUCT BULB TIP 10FT TU (MISCELLANEOUS) ×3 IMPLANT

## 2016-05-07 NOTE — Anesthesia Procedure Notes (Signed)
Spinal Patient location during procedure: OR Staffing Anesthesiologist: Shala Baumbach Performed by: anesthesiologist  Preanesthetic Checklist Completed: patient identified, site marked, surgical consent, pre-op evaluation, timeout performed, IV checked, risks and benefits discussed and monitors and equipment checked Spinal Block Patient position: sitting Prep: ChloraPrep Patient monitoring: heart rate, continuous pulse ox and blood pressure Approach: midline Location: L4-5 Injection technique: single-shot Needle Needle type: Sprotte  Needle gauge: 24 G Needle length: 9 cm Additional Notes Expiration date of kit checked and confirmed. Patient tolerated procedure well, without complications.     

## 2016-05-07 NOTE — Progress Notes (Signed)
Placed patient on CPAP for the night on her home setting of 12cm H20.  Patient is tolerating well at this time.

## 2016-05-07 NOTE — Op Note (Signed)
Pre-operative diagnosis- Osteoarthritis  Right knee(s)  Post-operative diagnosis- Osteoarthritis Right knee(s)  Procedure-  Right  Total Knee Arthroplasty (Depuy Attune)  Surgeon- Gus RankinFrank V. Valissa Lyvers, MD  Assistant- Dimitri PedAmber Constable, PA-C   Anesthesia-  Spinal  EBL-* No blood loss amount entered *   Drains Hemovac  Tourniquet time-  Total Tourniquet Time Documented: Thigh (Right) - 31 minutes Total: Thigh (Right) - 31 minutes     Complications- None  Condition-PACU - hemodynamically stable.   Brief Clinical Note  Kristin [name] is a 60 y.o. year old female with end stage OA of her right knee with progressively worsening pain and dysfunction. She has constant pain, with activity and at rest and significant functional deficits with difficulties even with ADLs. She has had extensive non-op management including analgesics, injections of cortisone and viscosupplements, and home exercise program, but remains in significant pain with significant dysfunction.Radiographs show bone on bone arthritis patellofemoral with medial narrowing. She presents now for right Total Knee Arthroplasty.    Procedure in detail---   The patient is brought into the operating room and positioned supine on the operating table. After successful administration of  Spinal,   a tourniquet is placed high on the  Right thigh(s) and the lower extremity is prepped and draped in the usual sterile fashion. Time out is performed by the operating team and then the  Right lower extremity is wrapped in Esmarch, knee flexed and the tourniquet inflated to 300 mmHg.       A midline incision is made with a ten blade through the subcutaneous tissue to the level of the extensor mechanism. A fresh blade is used to make a medial parapatellar arthrotomy. Soft tissue over the proximal medial tibia is subperiosteally elevated to the joint line with a knife and into the semimembranosus bursa with a Cobb elevator. Soft tissue over the proximal  lateral tibia is elevated with attention being paid to avoiding the patellar tendon on the tibial tubercle. The patella is everted, knee flexed 90 degrees and the ACL and PCL are removed. Findings are bone on bone patellofemoral with exposed bone medial.        The drill is used to create a starting hole in the distal femur and the canal is thoroughly irrigated with sterile saline to remove the fatty contents. The 5 degree Right  valgus alignment guide is placed into the femoral canal and the distal femoral cutting block is pinned to remove 9 mm off the distal femur. Resection is made with an oscillating saw.      The tibia is subluxed forward and the menisci are removed. The extramedullary alignment guide is placed referencing proximally at the medial aspect of the tibial tubercle and distally along the second metatarsal axis and tibial crest. The block is pinned to remove 2mm off the more deficient medial  side. Resection is made with an oscillating saw. Size 4is the most appropriate size for the tibia and the proximal tibia is prepared with the modular drill and keel punch for that size.      The femoral sizing guide is placed and size 5 is most appropriate. Rotation is marked off the epicondylar axis and confirmed by creating a rectangular flexion gap at 90 degrees. The size 5 cutting block is pinned in this rotation and the anterior, posterior and chamfer cuts are made with the oscillating saw. The intercondylar block is then placed and that cut is made.      Trial size 4 tibial component,  trial size 5 narrow posterior stabilized femur and a 7  mm posterior stabilized rotating platform insert trial is placed. Full extension is achieved with excellent varus/valgus and anterior/posterior balance throughout full range of motion. The patella is everted and thickness measured to be 22  mm. Free hand resection is taken to 12 mm, a 35 template is placed, lug holes are drilled, trial patella is placed, and it  tracks normally. Osteophytes are removed off the posterior femur with the trial in place. All trials are removed and the cut bone surfaces prepared with pulsatile lavage. Cement is mixed and once ready for implantation, the size 4 tibial implant, size  5 narrow posterior stabilized femoral component, and the size 35 patella are cemented in place and the patella is held with the clamp. The trial insert is placed and the knee held in full extension. The Exparel (20 ml mixed with 30 ml saline) and .25% Bupivicaine, are injected into the extensor mechanism, posterior capsule, medial and lateral gutters and subcutaneous tissues.  All extruded cement is removed and once the cement is hard the permanent 7 mm posterior stabilized rotating platform insert is placed into the tibial tray.      The wound is copiously irrigated with saline solution and the extensor mechanism closed over a hemovac drain with #1 V-loc suture. The tourniquet is released for a total tourniquet time of 31  minutes. Flexion against gravity is 140 degrees and the patella tracks normally. Subcutaneous tissue is closed with 2.0 vicryl and subcuticular with running 4.0 Monocryl. The incision is cleaned and dried and steri-strips and a bulky sterile dressing are applied. The limb is placed into a knee immobilizer and the patient is awakened and transported to recovery in stable condition.      Please note that a surgical assistant was a medical necessity for this procedure in order to perform it in a safe and expeditious manner. Surgical assistant was necessary to retract the ligaments and vital neurovascular structures to prevent injury to them and also necessary for proper positioning of the limb to allow for anatomic placement of the prosthesis.   Gus Rankin Kristin Maxfield, MD    05/07/2016, 9:57 AM

## 2016-05-07 NOTE — Transfer of Care (Signed)
Immediate Anesthesia Transfer of Care Note  Patient: Kristin [name]  Procedure(s) Performed: Procedure(s): RIGHT TOTAL KNEE ARTHROPLASTY (Right)  Patient Location: PACU  Anesthesia Type:Spinal  Level of Consciousness: awake, alert , oriented and patient cooperative  Airway & Oxygen Therapy: Patient Spontanous Breathing and Patient connected to face mask oxygen  Post-op Assessment: Report given to RN, Post -op Vital signs reviewed and stable and Patient moving all extremities  Post vital signs: Reviewed and stable  Last Vitals: There were no vitals filed for this visit.  Last Pain: There were no vitals filed for this visit.    Patients Stated Pain Goal: 3 (05/07/16 0813)  Complications: No apparent anesthesia complications

## 2016-05-07 NOTE — Anesthesia Postprocedure Evaluation (Signed)
Anesthesia Post Note  Patient: Kristin [name]  Procedure(s) Performed: Procedure(s) (LRB): RIGHT TOTAL KNEE ARTHROPLASTY (Right)  Patient location during evaluation: PACU Anesthesia Type: Spinal Level of consciousness: oriented and awake and alert Pain management: pain level controlled Vital Signs Assessment: post-procedure vital signs reviewed and stable Respiratory status: spontaneous breathing, respiratory function stable and patient connected to nasal cannula oxygen Cardiovascular status: blood pressure returned to baseline and stable Postop Assessment: no headache, no backache and spinal receding Anesthetic complications: no    Last Vitals:  Filed Vitals:   05/07/16 1145 05/07/16 1312  BP:  121/66  Pulse: 50 64  Temp: 36.7 C 37.1 C  Resp: 16 18    Last Pain:  Filed Vitals:   05/07/16 1313  PainSc: 0-No pain                 Phillips Groutarignan, Elihu Milstein

## 2016-05-07 NOTE — Anesthesia Preprocedure Evaluation (Addendum)
Anesthesia Evaluation  Patient identified by MRN, date of birth, ID band Patient awake    Reviewed: Allergy & Precautions, NPO status , Patient's Chart, lab work & pertinent test results  History of Anesthesia Complications (+) PONV  Airway Mallampati: II  TM Distance: >3 FB Neck ROM: Full    Dental no notable dental hx.    Pulmonary sleep apnea and Continuous Positive Airway Pressure Ventilation ,    Pulmonary exam normal breath sounds clear to auscultation       Cardiovascular negative cardio ROS Normal cardiovascular exam Rhythm:Regular Rate:Normal     Neuro/Psych negative neurological ROS  negative psych ROS   GI/Hepatic negative GI ROS, Neg liver ROS,   Endo/Other  Hypothyroidism   Renal/GU negative Renal ROS  negative genitourinary   Musculoskeletal negative musculoskeletal ROS (+)   Abdominal   Peds negative pediatric ROS (+)  Hematology negative hematology ROS (+)   Anesthesia Other Findings   Reproductive/Obstetrics negative OB ROS                            Anesthesia Physical Anesthesia Plan  ASA: II  Anesthesia Plan: Spinal   Post-op Pain Management:    Induction:   Airway Management Planned: Simple Face Mask  Additional Equipment:   Intra-op Plan:   Post-operative Plan:   Informed Consent: I have reviewed the patients History and Physical, chart, labs and discussed the procedure including the risks, benefits and alternatives for the proposed anesthesia with the patient or authorized representative who has indicated his/her understanding and acceptance.   Dental advisory given  Plan Discussed with: CRNA  Anesthesia Plan Comments:         Anesthesia Quick Evaluation

## 2016-05-07 NOTE — Discharge Instructions (Addendum)
° °Dr. Frank Aluisio °Total Joint Specialist °[facility] °[address] °[place], [place] [address] °[phone] ° °TOTAL KNEE REPLACEMENT POSTOPERATIVE DIRECTIONS ° °Knee Rehabilitation, Guidelines Following Surgery  °Results after knee surgery are often greatly improved when you follow the exercise, range of motion and muscle strengthening exercises prescribed by your doctor. Safety measures are also important to protect the knee from further injury. Any time any of these exercises cause you to have increased pain or swelling in your knee joint, decrease the amount until you are comfortable again and slowly increase them. If you have problems or questions, call your caregiver or physical therapist for advice.  ° °HOME CARE INSTRUCTIONS  °Remove items at home which could result in a fall. This includes throw rugs or furniture in walking pathways.  °· ICE to the affected knee every three hours for 30 minutes at a time and then as needed for pain and swelling.  Continue to use ice on the knee for pain and swelling from surgery. You may notice swelling that will progress down to the foot and ankle.  This is normal after surgery.  Elevate the leg when you are not up walking on it.   °· Continue to use the breathing machine which will help keep your temperature down.  It is common for your temperature to cycle up and down following surgery, especially at night when you are not up moving around and exerting yourself.  The breathing machine keeps your lungs expanded and your temperature down. °· Do not place pillow under knee, focus on keeping the knee straight while resting ° °DIET °You may resume your previous home diet once your are discharged from the hospital. ° °DRESSING / WOUND CARE / SHOWERING °You may start showering once you are discharged home but do not submerge the incision under water. Just pat the incision dry and apply a dry gauze dressing on daily. °Change the surgical dressing  daily and reapply a dry dressing each time. ° °ACTIVITY °Walk with your walker as instructed. °Use walker as long as suggested by your caregivers. °Avoid periods of inactivity such as sitting longer than an hour when not asleep. This helps prevent blood clots.  °You may resume a sexual relationship in one month or when given the OK by your doctor.  °You may return to work once you are cleared by your doctor.  °Do not drive a car for 6 weeks or until released by you surgeon.  °Do not drive while taking narcotics. ° °WEIGHT BEARING °Weight bearing as tolerated with assist device (walker, cane, etc) as directed, use it as long as suggested by your surgeon or therapist, typically at least 4-6 weeks. ° °POSTOPERATIVE CONSTIPATION PROTOCOL °Constipation - defined medically as fewer than three stools per week and severe constipation as less than one stool per week. ° °One of the most common issues patients have following surgery is constipation.  Even if you have a regular bowel pattern at home, your normal regimen is likely to be disrupted due to multiple reasons following surgery.  Combination of anesthesia, postoperative narcotics, change in appetite and fluid intake all can affect your bowels.  In order to avoid complications following surgery, here are some recommendations in order to help you during your recovery period. ° °Colace (docusate) - Pick up an over-the-counter form of Colace or another stool softener and take twice a day as long as you are requiring postoperative pain medications.  Take with a full glass of water   daily.  If you experience loose stools or diarrhea, hold the colace until you stool forms back up.  If your symptoms do not get better within 1 week or if they get worse, check with your doctor. ° °Dulcolax (bisacodyl) - Pick up over-the-counter and take as directed by the product packaging as needed to assist with the movement of your bowels.  Take with a full glass of water.  Use this product as  needed if not relieved by Colace only.  ° °MiraLax (polyethylene glycol) - Pick up over-the-counter to have on hand.  MiraLax is a solution that will increase the amount of water in your bowels to assist with bowel movements.  Take as directed and can mix with a glass of water, juice, soda, coffee, or tea.  Take if you go more than two days without a movement. °Do not use MiraLax more than once per day. Call your doctor if you are still constipated or irregular after using this medication for 7 days in a row. ° °If you continue to have problems with postoperative constipation, please contact the office for further assistance and recommendations.  If you experience "the worst abdominal pain ever" or develop nausea or vomiting, please contact the office immediatly for further recommendations for treatment. ° °ITCHING ° If you experience itching with your medications, try taking only a single pain pill, or even half a pain pill at a time.  You can also use Benadryl over the counter for itching or also to help with sleep.  ° °TED HOSE STOCKINGS °Wear the elastic stockings on both legs for three weeks following surgery during the day but you may remove then at night for sleeping. ° °MEDICATIONS °See your medication summary on the “After Visit Summary” that the nursing staff will review with you prior to discharge.  You may have some home medications which will be placed on hold until you complete the course of blood thinner medication.  It is important for you to complete the blood thinner medication as prescribed by your surgeon.  Continue your approved medications as instructed at time of discharge. ° °PRECAUTIONS °If you experience chest pain or shortness of breath - call 911 immediately for transfer to the hospital emergency department.  °If you develop a fever greater that 101 F, purulent drainage from wound, increased redness or drainage from wound, foul odor from the wound/dressing, or calf pain - CONTACT YOUR  SURGEON.   °                                                °FOLLOW-UP APPOINTMENTS °Make sure you keep all of your appointments after your operation with your surgeon and caregivers. You should call the office at the above phone number and make an appointment for approximately two weeks after the date of your surgery or on the date instructed by your surgeon outlined in the "After Visit Summary". ° ° °RANGE OF MOTION AND STRENGTHENING EXERCISES  °Rehabilitation of the knee is important following a knee injury or an operation. After just a few days of immobilization, the muscles of the thigh which control the knee become weakened and shrink (atrophy). Knee exercises are designed to build up the tone and strength of the thigh muscles and to improve knee motion. Often times heat used for twenty to thirty minutes before working out will loosen   up your tissues and help with improving the range of motion but do not use heat for the first two weeks following surgery. These exercises can be done on a training (exercise) mat, on the floor, on a table or on a bed. Use what ever works the best and is most comfortable for you Knee exercises include:  °Leg Lifts - While your knee is still immobilized in a splint or cast, you can do straight leg raises. Lift the leg to 60 degrees, hold for 3 sec, and slowly lower the leg. Repeat 10-20 times 2-3 times daily. Perform this exercise against resistance later as your knee gets better.  °Quad and Hamstring Sets - Tighten up the muscle on the front of the thigh (Quad) and hold for 5-10 sec. Repeat this 10-20 times hourly. Hamstring sets are done by pushing the foot backward against an object and holding for 5-10 sec. Repeat as with quad sets.  °· Leg Slides: Lying on your back, slowly slide your foot toward your buttocks, bending your knee up off the floor (only go as far as is comfortable). Then slowly slide your foot back down until your leg is flat on the floor again. °· Angel Wings:  Lying on your back spread your legs to the side as far apart as you can without causing discomfort.  °A rehabilitation program following serious knee injuries can speed recovery and prevent re-injury in the future due to weakened muscles. Contact your doctor or a physical therapist for more information on knee rehabilitation.  ° °IF YOU ARE TRANSFERRED TO A SKILLED REHAB FACILITY °If the patient is transferred to a skilled rehab facility following release from the hospital, a list of the current medications will be sent to the facility for the patient to continue.  When discharged from the skilled rehab facility, please have the facility set up the patient's Home Health Physical Therapy prior to being released. Also, the skilled facility will be responsible for providing the patient with their medications at time of release from the facility to include their pain medication, the muscle relaxants, and their blood thinner medication. If the patient is still at the rehab facility at time of the two week follow up appointment, the skilled rehab facility will also need to assist the patient in arranging follow up appointment in our office and any transportation needs. ° °MAKE SURE YOU:  °Understand these instructions.  °Get help right away if you are not doing well or get worse.  ° ° °Pick up stool softner and laxative for home use following surgery while on pain medications. °Do not submerge incision under water. °Please use good hand washing techniques while changing dressing each day. °May shower starting three days after surgery. °Please use a clean towel to pat the incision dry following showers. °Continue to use ice for pain and swelling after surgery. °Do not use any lotions or creams on the incision until instructed by your surgeon. ° °Information on my medicine - XARELTO® (Rivaroxaban) ° °This medication education was reviewed with me or my healthcare representative as part of my discharge preparation.  The  pharmacist that spoke with me during my hospital stay was:  Nichlas Pitera Michelle, RPH ° °Why was Xarelto® prescribed for you? °Xarelto® was prescribed for you to reduce the risk of blood clots forming after orthopedic surgery. The medical term for these abnormal blood clots is venous thromboembolism (VTE). ° °What do you need to know about xarelto® ? °Take your Xarelto® ONCE DAILY at   the same time every day. °You may take it either with or without food. ° °If you have difficulty swallowing the tablet whole, you may crush it and mix in applesauce just prior to taking your dose. ° °Take Xarelto® exactly as prescribed by your doctor and DO NOT stop taking Xarelto® without talking to the doctor who prescribed the medication.  Stopping without other VTE prevention medication to take the place of Xarelto® may increase your risk of developing a clot. ° °After discharge, you should have regular check-up appointments with your healthcare provider that is prescribing your Xarelto®.   ° °What do you do if you miss a dose? °If you miss a dose, take it as soon as you remember on the same day then continue your regularly scheduled once daily regimen the next day. Do not take two doses of Xarelto® on the same day.  ° °Important Safety Information °A possible side effect of Xarelto® is bleeding. You should call your healthcare provider right away if you experience any of the following: °? Bleeding from an injury or your nose that does not stop. °? Unusual colored urine (red or dark brown) or unusual colored stools (red or black). °? Unusual bruising for unknown reasons. °? A serious fall or if you hit your head (even if there is no bleeding). ° °Some medicines may interact with Xarelto® and might increase your risk of bleeding while on Xarelto®. To help avoid this, consult your healthcare provider or pharmacist prior to using any new prescription or non-prescription medications, including herbals, vitamins, non-steroidal  anti-inflammatory drugs (NSAIDs) and supplements. ° °This website has more information on Xarelto®: www.xarelto.com. ° ° °

## 2016-05-07 NOTE — H&P (View-Only) (Signed)
Kristin [name] DOB: 08/07/1956 Married / Language: English / Race: White Female Date of Admission:  05/07/2016 CC:  Right Knee Pain History of Present Illness The patient is a 60 year old female who comes in for a preoperative History and Physical. The patient is scheduled for a right total knee arthroplasty to be performed by Dr. Gus RankinFrank V. Aluisio, [facility] on 05-07-2016. The patient is a 60 year old female who presented with knee complaints. The patient was seen for a second opinion. The patient reports right knee symptoms including: pain, swelling, instability, weakness, stiffness and soreness which began 8 month(s) ago without any known injury.The patient feels that the symptoms are worsening. The patient has the current diagnosis of knee osteoarthritis. Prior to being seen, the patient was previously evaluated by a colleague (Dr. [name], as well as her pcp). Previous work-up for this problem has included knee x-rays. Past treatment for this problem has included application of ice, intra-articular injection of corticosteroids (in December, did not help) and physical therapy. The patient reports that symptoms radiate to the right hip, right thigh and right lower leg. Current treatment includes nonsteroidal anti-inflammatory drugs (Aleve, prn). She states that her right knee has gotten progressively worse over time. It is bothering for quite a while, but gotten much worse in the last eight months. It limits what she can and cannot do. She had cortisone injection and Synvisc One with not any benefit. It hurts day and night. She does not get swelling. It is not giving out on her. She does not have any problems with her left knee currently. She denies having any hip pain, lower extremity weakness or paresthesias. She is ready to proceed with surgery at this time. They have been treated conservatively in the past for the above stated problem and despite conservative measures, they continue to  have progressive pain and severe functional limitations and dysfunction. They have failed non-operative management including home exercise, medications, and injections. It is felt that they would benefit from undergoing total joint replacement. Risks and benefits of the procedure have been discussed with the patient and they elect to proceed with surgery. There are no active contraindications to surgery such as ongoing infection or rapidly progressive neurological disease.   Problem List/Past Medical  Primary osteoarthritis of right knee (M17.11)  Chronic Pain  Depression  Gastroesophageal Reflux Disease  Hypopituitarism  Hypothyroidism  Sleep Apnea  uses CPAP Hemorrhoids  Bronchitis  Past History Migraine Headache  Remote History Menopause  Non-alcoholic Fatty Liver Disease  Hepatic Hemangioma  via MRI Scan Moderate Spleenomegaly via MRI  Thrombocytopenia  Allergies Dilaudid *ANALGESICS - OPIOID*  Nausea, Rash.  Family History Cancer  father and grandmother fathers side Cerebrovascular Accident  father Chronic Obstructive Lung Disease  father Depression  mother Diabetes Mellitus  mother Heart Disease  grandfather fathers side Hypertension  mother Osteoarthritis  mother  Social History Alcohol use  never consumed alcohol Children  2 Current work status  working full time Drug/Alcohol Rehab (Currently)  no Drug/Alcohol Rehab (Previously)  no Exercise  Exercises daily; does other Illicit drug use  no Living situation  live with spouse Marital status  married Number of flights of stairs before winded  2-3 Pain Contract  no Tobacco / smoke exposure  no Tobacco use  never smoker Insurance claims handlerAdvance Directives  Living Will, Healthcare POA Post-Surgical Plans  HOME  Medication History ValACYclovir HCl (1GM Tablet, 1/2 Oral daily) Active. Sertraline HCl (100MG  Tablet, 1 1/2 Oral daily)  Active. Omeprazole (20MG  Capsule DR, Oral two times daily)  Active. Levothyroxine Sodium ( Tablet, Oral) Active. Aleve (220MG  Tablet, Oral as needed) Active. Iron Support (27mg  Oral) Active. Calcium + D (630+400 Oral) Specific strength unknown - Active.   Past Surgical History  Tonsillectomy  Date: 1964. Carpal Tunnel Repair  bilateral: Right - 2001, Left - 2007 Colonoscopy, Endoscopy  Date: 03/2007. Hysterectomy  Date: 09/2009. partial (non-cancerous), Bladder Tack Procedure Anal Fissure Repair  Date: 2013. Rotator Cuff Repair  Date: 03/2013. Bunion surgery  Date: 08/2014. Endoscopy  Date: 2015.  Review of Systems  General Not Present- Chills, Fatigue, Fever, Memory Loss, Night Sweats, Weight Gain and Weight Loss. Skin Not Present- Eczema, Hives, Itching, Lesions and Rash. HEENT Not Present- Dentures, Double Vision, Headache, Hearing Loss, Tinnitus and Visual Loss. Respiratory Not Present- Allergies, Chronic Cough, Coughing up blood, Shortness of breath at rest and Shortness of breath with exertion. Cardiovascular Not Present- Chest Pain, Difficulty Breathing Lying Down, Murmur, Palpitations, Racing/skipping heartbeats and Swelling. Gastrointestinal Not Present- Abdominal Pain, Bloody Stool, Constipation, Diarrhea, Difficulty Swallowing, Heartburn, Jaundice, Loss of appetitie, Nausea and Vomiting. Female Genitourinary Not Present- Blood in Urine, Discharge, Flank Pain, Incontinence, Painful Urination, Urgency, Urinary frequency, Urinary Retention, Urinating at Night and Weak urinary stream. Musculoskeletal Present- Back Pain, Joint Pain, Joint Swelling, Morning Stiffness, Muscle Pain and Muscle Weakness. Not Present- Spasms. Neurological Not Present- Blackout spells, Difficulty with balance, Dizziness, Paralysis, Tremor and Weakness. Psychiatric Not Present- Insomnia.  Vitals Weight: 197 lb Height: 61.5in Weight was reported by patient. Height was reported by patient. Body Surface Area: 1.89 m Body Mass Index: 36.62  kg/m  Pulse: 76 (Regular)  BP: 142/88 (Sitting, Left Arm, Standard)   Physical Exam  General Mental Status -Alert, cooperative and good historian. General Appearance-pleasant, Not in acute distress. Orientation-Oriented X3. Build & Nutrition-Well nourished and Well developed.  Head and Neck Head-normocephalic, atraumatic . Neck Global Assessment - supple, no bruit auscultated on the right, no bruit auscultated on the left.  Eye Pupil - Bilateral-Regular and Round. Motion - Bilateral-EOMI.  Chest and Lung Exam Auscultation Breath sounds - clear at anterior chest wall and clear at posterior chest wall. Adventitious sounds - No Adventitious sounds.  Cardiovascular Auscultation Rhythm - Regular rate and rhythm. Heart Sounds - S1 WNL and S2 WNL. Murmurs & Other Heart Sounds - Auscultation of the heart reveals - No Murmurs.  Abdomen Palpation/Percussion Tenderness - Abdomen is non-tender to palpation. Rigidity (guarding) - Abdomen is soft. Auscultation Auscultation of the abdomen reveals - Bowel sounds normal.  Female Genitourinary Note: Not done, not pertinent to present illness   Musculoskeletal Note: A well developed female in no distress. Her hips show normal range of motion with no discomfort. Left knee, no effusion. Range of motion is about 0 to 125. No tenderness or instability. Right knee, slight varus. Range 5 to 120. Marked crepitus on range of motion. Tenderness medial greater than lateral. No instability. Pulse, sensation and motor are intact in both lower extremities.  RADIOGRAPHS AP of both knees and lateral of both show that the left knee has minimal degenerative change. Right knee is bone on bone medial and patellofemoral.  Assessment & Plan Primary osteoarthritis of right knee (M17.11)  Note:Surgical Plans: Right Total Knee Replacement  Disposition: Home  PCP: Dr. Lucila Maine - Patient has been seen preoperatively and felt to be  stable for surgery. "No specific concerns and see note and EKG."  IV TXA  Anesthesia Issues: None  VERITAS STUDY PATIENT  Virtual PT @ Home - No Home Health  Signed electronically by Beckey Rutter, III PA-C

## 2016-05-07 NOTE — Interval H&P Note (Signed)
History and Physical Interval Note:  05/07/2016 6:42 AM  Kristin [name]  has presented today for surgery, with the diagnosis of RIGHT KNEE OA  The various methods of treatment have been discussed with the patient and family. After consideration of risks, benefits and other options for treatment, the patient has consented to  Procedure(s): RIGHT TOTAL KNEE ARTHROPLASTY (Right) as a surgical intervention .  The patient's history has been reviewed, patient examined, no change in status, stable for surgery.  I have reviewed the patient's chart and labs.  Questions were answered to the patient's satisfaction.     Loanne DrillingALUISIO,Von Quintanar V

## 2016-05-08 LAB — BASIC METABOLIC PANEL
Anion gap: 4 — ABNORMAL LOW (ref 5–15)
BUN: 14 mg/dL (ref 6–20)
CHLORIDE: 107 mmol/L (ref 101–111)
CO2: 29 mmol/L (ref 22–32)
Calcium: 8.5 mg/dL — ABNORMAL LOW (ref 8.9–10.3)
Creatinine, Ser: 0.72 mg/dL (ref 0.44–1.00)
GFR calc Af Amer: >60 mL/min (ref >60)
GFR calc non Af Amer: >60 mL/min (ref >60)
GLUCOSE: 104 mg/dL — AB (ref 65–99)
POTASSIUM: 4.1 mmol/L (ref 3.5–5.1)
Sodium: 140 mmol/L (ref 135–145)

## 2016-05-08 LAB — CBC
HCT: 35.5 % — ABNORMAL LOW (ref 36.0–46.0)
HEMOGLOBIN: 12.2 g/dL (ref 12.0–15.0)
MCH: 30.8 pg (ref 26.0–34.0)
MCHC: 34.4 g/dL (ref 30.0–36.0)
MCV: 89.6 fL (ref 78.0–100.0)
Platelets: 151 10*3/uL (ref 150–400)
RBC: 3.96 MIL/uL (ref 3.87–5.11)
RDW: 13.9 % (ref 11.5–15.5)
WBC: 9.8 10*3/uL (ref 4.0–10.5)

## 2016-05-08 NOTE — Progress Notes (Signed)
Physical Therapy Treatment Patient Details Name: Kristin [name] MRN: [id number] DOB: 10/29/1956 Today's Date: 05/08/2016    History of Present Illness RTKA    PT Comments    Progressing well. Plans DC tomorrow.  Follow Up Recommendations   (virtual)     Equipment Recommendations  Rolling walker with 5" wheels    Recommendations for Other Services       Precautions / Restrictions Precautions Precautions: Knee Precaution Comments: did nt require KI Required Braces or Orthoses: Knee Immobilizer - Right Knee Immobilizer - Right: Discontinue once straight leg raise with < 10 degree lag Restrictions Weight Bearing Restrictions: No Other Position/Activity Restrictions: WBAT    Mobility  Bed Mobility Overal bed mobility: Needs Assistance Bed Mobility: Supine to Sit;Sit to Supine     Supine to sit: Supervision Sit to supine: Supervision   General bed mobility comments: managed her  r leg  Transfers   Equipment used: Rolling walker (2 wheeled) Transfers: Sit to/from Stand Sit to Stand: Min guard         General transfer comment: cues for technique  Ambulation/Gait Ambulation/Gait assistance: Min guard Ambulation Distance (Feet): 150 Feet Assistive device: Rolling walker (2 wheeled) Gait Pattern/deviations: Step-to pattern;Step-through pattern;Antalgic     General Gait Details: cues for sequence and position inside the RW   Stairs            Wheelchair Mobility    Modified Rankin (Stroke Patients Only)       Balance                                    Cognition Arousal/Alertness: Awake/alert Behavior During Therapy: WFL for tasks assessed/performed Overall Cognitive Status: Within Functional Limits for tasks assessed                      Exercises      General Comments        Pertinent Vitals/Pain Pain Assessment: 0-10 Pain Score: 4  Pain Location: rt knee Pain Descriptors / Indicators:  Aching;Tightness Pain Intervention(s): Monitored during session;Premedicated before session    Home Living Family/patient expects to be discharged to:: Private residence Living Arrangements: Children;Spouse/significant other Available Help at Discharge: Family Type of Home: House Home Access: Stairs to enter   Home Layout: One level Home Equipment: None      Prior Function Level of Independence: Independent          PT Goals (current goals can now be found in the care plan section) Acute Rehab PT Goals Patient Stated Goal: to go home Progress towards PT goals: Progressing toward goals    Frequency  7X/week    PT Plan Current plan remains appropriate    Co-evaluation             End of Session   Activity Tolerance: Patient tolerated treatment well Patient left: in bed;with call bell/phone within reach     Time: [id number] PT Time Calculation (min) (ACUTE ONLY): 19 min  Charges:  $Gait Training: 8-22 mins                    G Codes:      Kristin [name], Kristin [name] 05/08/2016, 4:55 PM

## 2016-05-08 NOTE — Care Management Note (Signed)
Case Management Note  Patient Details  Name: TEPHANIE ESCORCIA MRN: [id number] Date of Birth: [birth year]  Subjective/Objective:                  RIGHT TOTAL KNEE ARTHROPLASTY (Right) Action/Plan: Discharge planning Expected Discharge Date:  05/09/16               Expected Discharge Plan:  Home/Self Care  In-House Referral:     Discharge planning Services  CM Consult  Post Acute Care Choice:    Choice offered to:  Patient  DME Arranged:  [employer] [employer] youth DME Agency:  [employer]:  PT [facility] Agency:  Other - See comment  Status of Service:  Completed, signed off  If discussed at [place] of Stay Meetings, dates discussed:    Additional Comments: CM met with pt to confirm for Virtual PT; pt confirms.  CM called [facility] DME rep, Germaine to please deliver a youth rolling walker and 3n1 to room prior to discharge.  No other CM needs were communicated. Dellie Catholic, RN 05/08/2016, 1:04 PM

## 2016-05-08 NOTE — Progress Notes (Signed)
   Subjective: 1 Day Post-Op Procedure(s) (LRB): RIGHT TOTAL KNEE ARTHROPLASTY (Right) Patient reports pain as mild.   Patient seen in rounds with Dr. Lequita HaltAluisio. Patient is well, and has had no acute complaints or problems. Reports that she is feeling good. No issues overnight. She said she is surprised by how well she is doing.  Plan is to go Home after hospital stay.  Objective: Vital signs in last 24 hours: Temp:  [97.8 F (36.6 C)-99.4 F (37.4 C)] 98.1 F (36.7 C) (06/20 0535) Pulse Rate:  [44-66] 52 (06/20 0535) Resp:  [13-18] 16 (06/20 0535) BP: (110-149)/(66-90) 119/67 mmHg (06/20 0535) SpO2:  [92 %-100 %] 92 % (06/20 0535)  Intake/Output from previous day:  Intake/Output Summary (Last 24 hours) at 05/08/16 0732 Last data filed at 05/08/16 0610  Gross per 24 hour  Intake   3810 ml  Output   4823 ml  Net  -1013 ml    Intake/Output this shift:    Labs:  Recent Labs  05/08/16 0425  HGB 12.2    Recent Labs  05/08/16 0425  WBC 9.8  RBC 3.96  HCT 35.5*  PLT 151    Recent Labs  05/08/16 0425  NA 140  K 4.1  CL 107  CO2 29  BUN 14  CREATININE 0.72  GLUCOSE 104*  CALCIUM 8.5*    EXAM General - Patient is Alert and Oriented Extremity - Neurologically intact Intact pulses distally Dorsiflexion/Plantar flexion intact Dressing - dressing C/D/I Motor Function - intact, moving foot and toes well on exam.  Hemovac pulled without difficulty.  Past Medical History  Diagnosis Date  . Arthritis   . GERD (gastroesophageal reflux disease)   . Sleep apnea     uses a cpap  . Hypothyroidism   . Depression   . Anemia     takes OTC iron  . HOH (hard of hearing)   . Family history of anesthesia complication     mom gets nausea  . Complication of anesthesia   . PONV (postoperative nausea and vomiting)     due to the Dilaudid    Assessment/Plan: 1 Day Post-Op Procedure(s) (LRB): RIGHT TOTAL KNEE ARTHROPLASTY (Right) Principal Problem:   OA  (osteoarthritis) of knee  Estimated body mass index is 36.25 kg/(m^2) as calculated from the following:   Height as of this encounter: 5' 1.5" (1.562 m).   Weight as of this encounter: 88.451 kg (195 lb). Advance diet Up with therapy D/C IV fluids when tolerating POs well  DVT Prophylaxis - Xarelto Weight-Bearing as tolerated to right leg D/C O2 and Pulse OX and try on Room Air  PT today. Plan for DC home with [facility] virtual therapy tomorrow.   Dimitri PedAmber Bralyn Folkert, PA-C Orthopaedic Surgery 05/08/2016, 7:32 AM

## 2016-05-08 NOTE — Progress Notes (Signed)
Occupational Therapy Evaluation Patient Details Name: Kristin [name] MRN: [id number] DOB: 01/19/1956 Today's Date: 05/08/2016    History of Present Illness RTKA   Clinical Impression   Patient presents to OT with decreased ADL independence and safety s/p R TKA. Will benefit from skilled OT to maximize function and to facilitate a safe discharge. OT will follow.    Follow Up Recommendations  No OT follow up;Supervision/Assistance - 24 hour    Equipment Recommendations  3 in 1 bedside comode    Recommendations for Other Services       Precautions / Restrictions Precautions Precautions: Knee Precaution Comments: did nt require KI Required Braces or Orthoses: Knee Immobilizer - Right Knee Immobilizer - Right: Discontinue once straight leg raise with < 10 degree lag Restrictions Weight Bearing Restrictions: No Other Position/Activity Restrictions: WBAT      Mobility Bed Mobility               General bed mobility comments: in recliner  Transfers             General transfer comment: NT -- pt declined    Balance                                            ADL Overall ADL's : Needs assistance/impaired                                       General ADL Comments: Patient received up in recliner. Reports she is comfortable. Performed grooming activities from seated position. Educated patient regarding LB dressing techniques (start pants in sitting, dress operated leg first, then stand to pull up) and she verbalized understanding. Patient reports no difficulty toileting with nursing staff earlier today. Next session to focus on practicing LB dressing and practicing walk-in shower transfer.     Vision     Perception     Praxis      Pertinent Vitals/Pain Pain Assessment: 0-10 Pain Score: 3  Pain Location: R knee Pain Descriptors / Indicators: Aching;Sore Pain Intervention(s): Limited activity within patient's  tolerance;Monitored during session     Hand Dominance     Extremity/Trunk Assessment Upper Extremity Assessment Upper Extremity Assessment: Overall WFL for tasks assessed   Lower Extremity Assessment Lower Extremity Assessment: Defer to PT evaluation    Cervical / Trunk Assessment Cervical / Trunk Assessment: Normal   Communication Communication Communication: No difficulties   Cognition Arousal/Alertness: Awake/alert Behavior During Therapy: WFL for tasks assessed/performed Overall Cognitive Status: Within Functional Limits for tasks assessed                     General Comments       Exercises       Shoulder Instructions      Home Living Family/patient expects to be discharged to:: Private residence Living Arrangements: Children;Spouse/significant other Available Help at Discharge: Family Type of Home: House Home Access: Stairs to enter Secretary/administrator of Steps: 1   Home Layout: One level     Bathroom Shower/Tub: Producer, television/film/video: Standard Bathroom Accessibility: Yes How Accessible: Accessible via walker Home Equipment: None          Prior Functioning/Environment Level of Independence: Independent  OT Diagnosis: Acute pain   OT Problem List: Decreased strength;Decreased range of motion;Decreased activity tolerance;Decreased knowledge of use of DME or AE;Pain   OT Treatment/Interventions: Self-care/ADL training;DME and/or AE instruction;Therapeutic activities;Patient/family education    OT Goals(Current goals can be found in the care plan section) Acute Rehab OT Goals Patient Stated Goal: to go home OT Goal Formulation: With patient Time For Goal Achievement: 05/22/16 Potential to Achieve Goals: Good ADL Goals Pt Will Perform Lower Body Bathing: sit to/from stand;with supervision Pt Will Perform Lower Body Dressing: with supervision;sit to/from stand Pt Will Perform Tub/Shower Transfer: Shower  transfer;ambulating;3 in 1;rolling walker;with supervision  OT Frequency: Min 2X/week   Barriers to D/C:            Co-evaluation              End of Session    Activity Tolerance: Patient tolerated treatment well Patient left: in chair;with call bell/phone within reach;with chair alarm set   Time: 1043-1056 OT Time Calculation (min): 13 min Charges:  OT General Charges $OT Visit: 1 Procedure OT Evaluation $OT Eval Low Complexity: 1 Procedure G-Codes:    Anand Tejada A 05/08/2016, 1:43 PM

## 2016-05-08 NOTE — Evaluation (Signed)
Physical Therapy Evaluation Patient Details Name: Kristin [name] MRN: [id number] DOB: [birth year] Today's Date: 05/08/2016   History of Present Illness  RTKA  Clinical Impression  The patient tolerated therapy well. palns to have Virtual therapy at DC. Pt admitted with above diagnosis. Pt currently with functional limitations due to the deficits listed below (see PT Problem List).  Pt will benefit from skilled PT to increase their independence and safety with mobility to allow discharge to the venue listed below.       Follow Up Recommendations  (virtual PT)    Equipment Recommendations  Rolling walker with 5" wheels (youth)    Recommendations for Other Services       Precautions / Restrictions Precautions Precautions: Knee Precaution Comments: did nt require KI Required Braces or Orthoses: Knee Immobilizer - Right Knee Immobilizer - Right: Discontinue once straight leg raise with < 10 degree lag Restrictions Weight Bearing Restrictions: No      Mobility  Bed Mobility               General bed mobility comments: in recliner  Transfers Overall transfer level: Needs assistance Equipment used: Rolling walker (2 wheeled) Transfers: Sit to/from Stand Sit to Stand: Min assist         General transfer comment: cues for hand and rt leg position  Ambulation/Gait Ambulation/Gait assistance: Min assist Ambulation Distance (Feet): 80 Feet Assistive device: Rolling walker (2 wheeled) Gait Pattern/deviations: Step-to pattern;Step-through pattern     General Gait Details: cues for sequqnce and position inside the RW  Stairs            Wheelchair Mobility    Modified Rankin (Stroke Patients Only)       Balance                                             Pertinent Vitals/Pain Pain Assessment: 0-10 Pain Score: 3  Pain Descriptors / Indicators: Aching Pain Intervention(s): Premedicated before session;Ice applied;Monitored during  session    Home Living Family/patient expects to be discharged to:: Private residence Living Arrangements: Children;Spouse/significant other Available Help at Discharge: Family Type of Home: House Home Access: Stairs to enter   Secretary/administrator of Steps: 1 Home Layout: One level        Prior Function Level of Independence: Independent               Hand Dominance        Extremity/Trunk Assessment   Upper Extremity Assessment: Defer to OT evaluation           Lower Extremity Assessment: RLE deficits/detail RLE Deficits / Details: SLR, knee flexion 60 degrees    Cervical / Trunk Assessment: Normal  Communication   Communication: No difficulties  Cognition Arousal/Alertness: Awake/alert Behavior During Therapy: WFL for tasks assessed/performed Overall Cognitive Status: Within Functional Limits for tasks assessed                      General Comments      Exercises Total Joint Exercises Ankle Circles/Pumps: AROM;Both;10 reps Quad Sets: AROM;Both;10 reps Towel Squeeze: AROM;Both;10 reps Heel Slides: AAROM;Right;10 reps Hip ABduction/ADduction: AAROM;Right;10 reps Straight Leg Raises: AAROM;Right;10 reps      Assessment/Plan    PT Assessment Patient needs continued PT services  PT Diagnosis Difficulty walking;Generalized weakness;Acute pain   PT Problem List Decreased strength;Decreased range of motion;Decreased  activity tolerance;Decreased mobility;Decreased knowledge of use of DME;Decreased safety awareness;Decreased knowledge of precautions;Pain  PT Treatment Interventions DME instruction;Gait training;Functional mobility training;Therapeutic activities;Stair training;Therapeutic exercise;Patient/family education   PT Goals (Current goals can be found in the Care Plan section) Acute Rehab PT Goals Patient Stated Goal: to go home PT Goal Formulation: With patient Time For Goal Achievement: 05/11/16 Potential to Achieve Goals: Good     Frequency 7X/week   Barriers to discharge        Co-evaluation               End of Session Equipment Utilized During Treatment: Right knee immobilizer Activity Tolerance: Patient tolerated treatment well Patient left: in chair;with call bell/phone within reach Nurse Communication: Mobility status         Time: [id number] PT Time Calculation (min) (ACUTE ONLY): 24 min   Charges:   PT Evaluation $PT Eval Low Complexity: 1 Procedure PT Treatments $Gait Training: 8-22 mins   PT G Codes:        Rada HayHill, Kathe Wirick Elizabeth 05/08/2016, 10:47 AM Blanchard KelchKaren Teruo Stilley PT [phone]

## 2016-05-08 NOTE — Progress Notes (Signed)
Physical Therapy Treatment Patient Details Name: Karl LukeCrystal M Coop MRN: [id number] DOB: 11/30/1955 Today's Date: 05/08/2016    History of Present Illness RTKA    PT Comments    The patient is progressing well. Ready for DC.  Follow Up Recommendations   (virtual)     Equipment Recommendations  Rolling walker with 5" wheels    Recommendations for Other Services       Precautions / Restrictions Precautions Precautions: Knee Precaution Comments: did nt require KI Required Braces or Orthoses: Knee Immobilizer - Right Knee Immobilizer - Right: Discontinue once straight leg raise with < 10 degree lag Restrictions Weight Bearing Restrictions: No Other Position/Activity Restrictions: WBAT    Mobility  Bed Mobility Overal bed mobility: Needs Assistance Bed Mobility: Supine to Sit;Sit to Supine     Supine to sit: Supervision Sit to supine: Supervision   General bed mobility comments: managed her  r leg  Transfers   Equipment used: Rolling walker (2 wheeled) Transfers: Sit to/from Stand Sit to Stand: Min guard         General transfer comment: cues for technique  Ambulation/Gait Ambulation/Gait assistance: Min guard Ambulation Distance (Feet): 150 Feet Assistive device: Rolling walker (2 wheeled) Gait Pattern/deviations: Step-to pattern;Step-through pattern;Antalgic     General Gait Details: cues for sequence and position inside the RW   Stairs            Wheelchair Mobility    Modified Rankin (Stroke Patients Only)       Balance                                    Cognition Arousal/Alertness: Awake/alert Behavior During Therapy: WFL for tasks assessed/performed Overall Cognitive Status: Within Functional Limits for tasks assessed                      Exercises      General Comments        Pertinent Vitals/Pain Pain Assessment: 0-10 Pain Score: 4  Pain Location: rt knee Pain Descriptors / Indicators:  Aching;Tightness Pain Intervention(s): Monitored during session;Premedicated before session    Home Living Family/patient expects to be discharged to:: Private residence Living Arrangements: Children;Spouse/significant other Available Help at Discharge: Family Type of Home: House Home Access: Stairs to enter   Home Layout: One level Home Equipment: None      Prior Function Level of Independence: Independent          PT Goals (current goals can now be found in the care plan section) Acute Rehab PT Goals Patient Stated Goal: to go home Progress towards PT goals: Progressing toward goals    Frequency  7X/week    PT Plan Current plan remains appropriate    Co-evaluation             End of Session   Activity Tolerance: Patient tolerated treatment well Patient left: in bed;with call bell/phone within reach     Time: [id number] PT Time Calculation (min) (ACUTE ONLY): 19 min  Charges:  $Gait Training: 8-22 mins                    G Codes:      Rada HayHill, Coni Homesley Elizabeth 05/08/2016, 4:51 PM

## 2016-05-09 LAB — CBC
HCT: 33.6 % — ABNORMAL LOW (ref 36.0–46.0)
Hemoglobin: 11.4 g/dL — ABNORMAL LOW (ref 12.0–15.0)
MCH: 30.5 pg (ref 26.0–34.0)
MCHC: 33.9 g/dL (ref 30.0–36.0)
MCV: 89.8 fL (ref 78.0–100.0)
PLATELETS: 127 10*3/uL — AB (ref 150–400)
RBC: 3.74 MIL/uL — AB (ref 3.87–5.11)
RDW: 14.2 % (ref 11.5–15.5)
WBC: 7.5 10*3/uL (ref 4.0–10.5)

## 2016-05-09 LAB — BASIC METABOLIC PANEL
Anion gap: 5 (ref 5–15)
BUN: 12 mg/dL (ref 6–20)
CO2: 30 mmol/L (ref 22–32)
CREATININE: 0.71 mg/dL (ref 0.44–1.00)
Calcium: 8.5 mg/dL — ABNORMAL LOW (ref 8.9–10.3)
Chloride: 105 mmol/L (ref 101–111)
GFR calc Af Amer: >60 mL/min (ref >60)
GLUCOSE: 95 mg/dL (ref 65–99)
POTASSIUM: 3.9 mmol/L (ref 3.5–5.1)
SODIUM: 140 mmol/L (ref 135–145)

## 2016-05-09 NOTE — Progress Notes (Signed)
Pt to d/c home. DME delivered to room prior to d/c. AVS reviewed and "My Chart" discussed with pt. Pt capable of verbalizing medications, dressing changes, signs and symptoms of infection, and follow-up appointments. Remains hemodynamically stable. No signs and symptoms of distress. Educated pt to return to ER in the case of SOB, dizziness, or chest pain.  

## 2016-05-09 NOTE — Progress Notes (Signed)
   Subjective: 2 Days Post-Op Procedure(s) (LRB): RIGHT TOTAL KNEE ARTHROPLASTY (Right) Patient reports pain as mild.   Patient seen in rounds with Dr. Lequita HaltAluisio. Patient is well, and has had no acute complaints or problems. Voiding well. Positive flatus. Reports that therapy is going well.  Plan is to go Home after hospital stay.  Objective: Vital signs in last 24 hours: Temp:  [98.4 F (36.9 C)-100 F (37.8 C)] 100 F (37.8 C) (06/21 0612) Pulse Rate:  [55-60] 60 (06/21 0612) Resp:  [14-16] 14 (06/21 0612) BP: (103-129)/(57-68) 129/68 mmHg (06/21 0612) SpO2:  [94 %-98 %] 96 % (06/21 0612)  Intake/Output from previous day:  Intake/Output Summary (Last 24 hours) at 05/09/16 0834 Last data filed at 05/09/16 0700  Gross per 24 hour  Intake 2032.5 ml  Output   4625 ml  Net -2592.5 ml     Labs:  Recent Labs  05/08/16 0425 05/09/16 0418  HGB 12.2 11.4*    Recent Labs  05/08/16 0425 05/09/16 0418  WBC 9.8 7.5  RBC 3.96 3.74*  HCT 35.5* 33.6*  PLT 151 127*    Recent Labs  05/08/16 0425 05/09/16 0418  NA 140 140  K 4.1 3.9  CL 107 105  CO2 29 30  BUN 14 12  CREATININE 0.72 0.71  GLUCOSE 104* 95  CALCIUM 8.5* 8.5*    EXAM General - Patient is Alert and Oriented Extremity - Neurologically intact Intact pulses distally Dorsiflexion/Plantar flexion intact No cellulitis present Compartment soft Dressing/Incision - clean, dry, no drainage Motor Function - intact, moving foot and toes well on exam.   Past Medical History  Diagnosis Date  . Arthritis   . GERD (gastroesophageal reflux disease)   . Sleep apnea     uses a cpap  . Hypothyroidism   . Depression   . Anemia     takes OTC iron  . HOH (hard of hearing)   . Family history of anesthesia complication     mom gets nausea  . Complication of anesthesia   . PONV (postoperative nausea and vomiting)     due to the Dilaudid    Assessment/Plan: 2 Days Post-Op Procedure(s) (LRB): RIGHT TOTAL KNEE  ARTHROPLASTY (Right) Principal Problem:   OA (osteoarthritis) of knee  Estimated body mass index is 36.25 kg/(m^2) as calculated from the following:   Height as of this encounter: 5' 1.5" (1.562 m).   Weight as of this encounter: 88.451 kg (195 lb). Advance diet Up with therapy Discharge home with home health  DVT Prophylaxis - Xarelto Weight-Bearing as tolerated to right leg  PT this morning with plan for DC home today.   Dimitri PedAmber Kole Hilyard, PA-C Orthopaedic Surgery 05/09/2016, 8:34 AM

## 2016-05-09 NOTE — Progress Notes (Signed)
Occupational Therapy Evaluation Patient Details Name: Kristin [name] MRN: [id number] DOB: 08/01/1956 Today's Date: 05/09/2016    History of Present Illness RTKA   Clinical Impression   All OT education completed and pt questions answered. No further OT needs at this time. Will sign off.    Follow Up Recommendations  No OT follow up;Supervision - Intermittent    Equipment Recommendations  3 in 1 bedside comode    Recommendations for Other Services       Precautions / Restrictions Precautions Precautions: Knee Precaution Comments: did nt require KI Restrictions Weight Bearing Restrictions: No Other Position/Activity Restrictions: WBAT      Mobility Bed Mobility               General bed mobility comments: NT -- up in recliner  Transfers Overall transfer level: Needs assistance Equipment used: Rolling walker (2 wheeled) Transfers: Sit to/from Stand Sit to Stand: Supervision              Balance                                            ADL Overall ADL's : Needs assistance/impaired Eating/Feeding: Independent;Sitting   Grooming: Wash/dry hands;Supervision/safety;Standing           Upper Body Dressing : Set up;Sitting   Lower Body Dressing: Supervision/safety;Sit to/from stand   Toilet Transfer: Supervision/safety;Ambulation;Comfort height toilet;RW   Toileting- Clothing Manipulation and Hygiene: Supervision/safety;Sit to/from stand   Tub/ Shower Transfer: Walk-in shower;Supervision/safety;Ambulation;3 in 1;Rolling walker   Functional mobility during ADLs: Supervision/safety;Rolling walker       Vision     Perception     Praxis      Pertinent Vitals/Pain Pain Assessment: 0-10 Pain Score: 4  Pain Location: R knee Pain Descriptors / Indicators: Aching;Sore Pain Intervention(s): Limited activity within patient's tolerance;Monitored during session;Repositioned     Hand Dominance     Extremity/Trunk  Assessment             Communication     Cognition Arousal/Alertness: Awake/alert Behavior During Therapy: WFL for tasks assessed/performed Overall Cognitive Status: Within Functional Limits for tasks assessed                     General Comments       Exercises       Shoulder Instructions      Home Living                                          Prior Functioning/Environment               OT Diagnosis:     OT Problem List:     OT Treatment/Interventions:      OT Goals(Current goals can be found in the care plan section)    OT Frequency:     Barriers to D/C:            Co-evaluation              End of Session Equipment Utilized During Treatment: Rolling walker    Activity Tolerance: Patient tolerated treatment well Patient left: in chair;with call bell/phone within reach;with chair alarm set   Time: [id number] OT Time Calculation (min): 15 min Charges:  OT General Charges $OT Visit:  1 Procedure OT Treatments $Self Care/Home Management : 8-22 mins G-Codes:    Rilan Eiland A May 31, 2016, 9:08 AM

## 2016-05-09 NOTE — Discharge Summary (Signed)
Physician Discharge Summary   Patient ID: Kristin [name] MRN: [id number] DOB/AGE: May 22, 1956 60 y.o.  Admit date: 05/07/2016 Discharge date: 05/09/2016  Primary Diagnosis: Primary osteoarthritis right knee  Admission Diagnoses:  Past Medical History  Diagnosis Date  . Arthritis   . GERD (gastroesophageal reflux disease)   . Sleep apnea     uses a cpap  . Hypothyroidism   . Depression   . Anemia     takes OTC iron  . HOH (hard of hearing)   . Family history of anesthesia complication     mom gets nausea  . Complication of anesthesia   . PONV (postoperative nausea and vomiting)     due to the Dilaudid   Discharge Diagnoses:   Principal Problem:   OA (osteoarthritis) of knee  Estimated body mass index is 36.25 kg/(m^2) as calculated from the following:   Height as of this encounter: 5' 1.5" (1.562 m).   Weight as of this encounter: 88.451 kg (195 lb).  Procedure:  Procedure(s) (LRB): RIGHT TOTAL KNEE ARTHROPLASTY (Right)   Consults: None  HPI: The patient reports right knee symptoms including: pain, swelling, instability, weakness, stiffness and soreness which began 8 month(s) ago without any known injury.The patient feels that the symptoms are worsening. The patient has the current diagnosis of knee osteoarthritis. Prior to being seen, the patient was previously evaluated by a colleague (Dr. Adin Hector, as well as her pcp). Previous work-up for this problem has included knee x-rays. Past treatment for this problem has included application of ice, intra-articular injection of corticosteroids (in December, did not help) and physical therapy. The patient reports that symptoms radiate to the right hip, right thigh and right lower leg. Current treatment includes nonsteroidal anti-inflammatory drugs (Aleve, prn). She states that her right knee has gotten progressively worse over time. It is bothering for quite a while, but gotten much worse in the last eight months. It limits what she  can and cannot do. She had cortisone injection and Synvisc One with not any benefit. It hurts day and night. She does not get swelling. It is not giving out on her. She does not have any problems with her left knee currently. She denies having any hip pain, lower extremity weakness or paresthesias. She is ready to proceed with surgery at this time. They have been treated conservatively in the past for the above stated problem and despite conservative measures, they continue to have progressive pain and severe functional limitations and dysfunction. They have failed non-operative management including home exercise, medications, and injections. It is felt that they would benefit from undergoing total joint replacement. Risks and benefits of the procedure have been discussed with the patient and they elect to proceed with surgery. There are no active contraindications to surgery such as ongoing infection or rapidly progressive neurological disease.  Laboratory Data: Admission on 05/07/2016  Component Date Value Ref Range Status  . WBC 05/08/2016 9.8  4.0 - 10.5 K/uL Final  . RBC 05/08/2016 3.96  3.87 - 5.11 MIL/uL Final  . Hemoglobin 05/08/2016 12.2  12.0 - 15.0 g/dL Final  . HCT 05/08/2016 35.5* 36.0 - 46.0 % Final  . MCV 05/08/2016 89.6  78.0 - 100.0 fL Final  . MCH 05/08/2016 30.8  26.0 - 34.0 pg Final  . MCHC 05/08/2016 34.4  30.0 - 36.0 g/dL Final  . RDW 05/08/2016 13.9  11.5 - 15.5 % Final  . Platelets 05/08/2016 151  150 - 400 K/uL Final  . Sodium 05/08/2016 140  135 - 145 mmol/L Final  . Potassium 05/08/2016 4.1  3.5 - 5.1 mmol/L Final  . Chloride 05/08/2016 107  101 - 111 mmol/L Final  . CO2 05/08/2016 29  22 - 32 mmol/L Final  . Glucose, Bld 05/08/2016 104* 65 - 99 mg/dL Final  . BUN 05/08/2016 14  6 - 20 mg/dL Final  . Creatinine, Ser 05/08/2016 0.72  0.44 - 1.00 mg/dL Final  . Calcium 05/08/2016 8.5* 8.9 - 10.3 mg/dL Final  . GFR calc non Af Amer 05/08/2016 >60  >60 mL/min Final  .  GFR calc Af Amer 05/08/2016 >60  >60 mL/min Final   Comment: (NOTE) The eGFR has been calculated using the CKD EPI equation. This calculation has not been validated in all clinical situations. eGFR's persistently <60 mL/min signify possible Chronic Kidney Disease.   . Anion gap 05/08/2016 4* 5 - 15 Final  . WBC 05/09/2016 7.5  4.0 - 10.5 K/uL Final  . RBC 05/09/2016 3.74* 3.87 - 5.11 MIL/uL Final  . Hemoglobin 05/09/2016 11.4* 12.0 - 15.0 g/dL Final  . HCT 05/09/2016 33.6* 36.0 - 46.0 % Final  . MCV 05/09/2016 89.8  78.0 - 100.0 fL Final  . MCH 05/09/2016 30.5  26.0 - 34.0 pg Final  . MCHC 05/09/2016 33.9  30.0 - 36.0 g/dL Final  . RDW 05/09/2016 14.2  11.5 - 15.5 % Final  . Platelets 05/09/2016 127* 150 - 400 K/uL Final  . Sodium 05/09/2016 140  135 - 145 mmol/L Final  . Potassium 05/09/2016 3.9  3.5 - 5.1 mmol/L Final  . Chloride 05/09/2016 105  101 - 111 mmol/L Final  . CO2 05/09/2016 30  22 - 32 mmol/L Final  . Glucose, Bld 05/09/2016 95  65 - 99 mg/dL Final  . BUN 05/09/2016 12  6 - 20 mg/dL Final  . Creatinine, Ser 05/09/2016 0.71  0.44 - 1.00 mg/dL Final  . Calcium 05/09/2016 8.5* 8.9 - 10.3 mg/dL Final  . GFR calc non Af Amer 05/09/2016 >60  >60 mL/min Final  . GFR calc Af Amer 05/09/2016 >60  >60 mL/min Final   Comment: (NOTE) The eGFR has been calculated using the CKD EPI equation. This calculation has not been validated in all clinical situations. eGFR's persistently <60 mL/min signify possible Chronic Kidney Disease.   Georgiann Hahn gap 05/09/2016 5  5 - 15 Final  Hospital Outpatient Visit on 04/30/2016  Component Date Value Ref Range Status  . MRSA, PCR 04/30/2016 NEGATIVE  NEGATIVE Final  . Staphylococcus aureus 04/30/2016 NEGATIVE  NEGATIVE Final   Comment:        The Xpert SA Assay (FDA approved for NASAL specimens in patients over 77 years of age), is one component of a comprehensive surveillance program.  Test performance has been validated by [facility] for  patients greater than or equal to 80 year old. It is not intended to diagnose infection nor to guide or monitor treatment.   Marland Kitchen aPTT 04/30/2016 30  24 - 37 seconds Final  . WBC 04/30/2016 6.6  4.0 - 10.5 K/uL Final  . RBC 04/30/2016 4.51  3.87 - 5.11 MIL/uL Final  . Hemoglobin 04/30/2016 13.6  12.0 - 15.0 g/dL Final  . HCT 04/30/2016 39.3  36.0 - 46.0 % Final  . MCV 04/30/2016 87.1  78.0 - 100.0 fL Final  . MCH 04/30/2016 30.2  26.0 - 34.0 pg Final  . MCHC 04/30/2016 34.6  30.0 - 36.0 g/dL Final  . RDW 04/30/2016 13.7  11.5 -  15.5 % Final  . Platelets 04/30/2016 154  150 - 400 K/uL Final  . Sodium 04/30/2016 141  135 - 145 mmol/L Final  . Potassium 04/30/2016 4.7  3.5 - 5.1 mmol/L Final  . Chloride 04/30/2016 106  101 - 111 mmol/L Final  . CO2 04/30/2016 27  22 - 32 mmol/L Final  . Glucose, Bld 04/30/2016 91  65 - 99 mg/dL Final  . BUN 04/30/2016 22* 6 - 20 mg/dL Final  . Creatinine, Ser 04/30/2016 0.80  0.44 - 1.00 mg/dL Final  . Calcium 04/30/2016 9.6  8.9 - 10.3 mg/dL Final  . Total Protein 04/30/2016 6.9  6.5 - 8.1 g/dL Final  . Albumin 04/30/2016 4.3  3.5 - 5.0 g/dL Final  . AST 04/30/2016 23  15 - 41 U/L Final  . ALT 04/30/2016 24  14 - 54 U/L Final  . Alkaline Phosphatase 04/30/2016 74  38 - 126 U/L Final  . Total Bilirubin 04/30/2016 0.6  0.3 - 1.2 mg/dL Final  . GFR calc non Af Amer 04/30/2016 >60  >60 mL/min Final  . GFR calc Af Amer 04/30/2016 >60  >60 mL/min Final   Comment: (NOTE) The eGFR has been calculated using the CKD EPI equation. This calculation has not been validated in all clinical situations. eGFR's persistently <60 mL/min signify possible Chronic Kidney Disease.   . Anion gap 04/30/2016 8  5 - 15 Final  . Prothrombin Time 04/30/2016 13.3  11.6 - 15.2 seconds Final  . INR 04/30/2016 0.99  0.00 - 1.49 Final  . ABO/RH(D) 04/30/2016 O NEG   Final  . Antibody Screen 04/30/2016 NEG   Final  . Sample Expiration 04/30/2016 05/10/2016   Final  . Extend sample  reason 04/30/2016 NO TRANSFUSIONS OR PREGNANCY IN THE PAST 3 MONTHS   Final  . Color, Urine 04/30/2016 YELLOW  YELLOW Final  . APPearance 04/30/2016 CLEAR  CLEAR Final  . Specific Gravity, Urine 04/30/2016 1.016  1.005 - 1.030 Final  . pH 04/30/2016 5.0  5.0 - 8.0 Final  . Glucose, UA 04/30/2016 NEGATIVE  NEGATIVE mg/dL Final  . Hgb urine dipstick 04/30/2016 NEGATIVE  NEGATIVE Final  . Bilirubin Urine 04/30/2016 NEGATIVE  NEGATIVE Final  . Ketones, ur 04/30/2016 NEGATIVE  NEGATIVE mg/dL Final  . Protein, ur 04/30/2016 NEGATIVE  NEGATIVE mg/dL Final  . Nitrite 04/30/2016 NEGATIVE  NEGATIVE Final  . Leukocytes, UA 04/30/2016 NEGATIVE  NEGATIVE Final   MICROSCOPIC NOT DONE ON URINES WITH NEGATIVE PROTEIN, BLOOD, LEUKOCYTES, NITRITE, OR GLUCOSE <1000 mg/dL.      Hospital Course: Arlyne BIANCE MONCRIEF is a 60 y.o. who was admitted to [facility]. They were brought to the operating room on 05/07/2016 and underwent Procedure(s): RIGHT TOTAL KNEE ARTHROPLASTY.  Patient tolerated the procedure well and was later transferred to the recovery room and then to the orthopaedic floor for postoperative care.  They were given PO and IV analgesics for pain control following their surgery.  They were given 24 hours of postoperative antibiotics of  Anti-infectives    Start     Dose/Rate Route Frequency Ordered Stop   05/07/16 1530  ceFAZolin (ANCEF) IVPB 2g/100 mL premix     2 g 200 mL/hr over 30 Minutes Intravenous Every 6 hours 05/07/16 1159 05/07/16 2110   05/07/16 0637  ceFAZolin (ANCEF) IVPB 2g/100 mL premix     2 g 200 mL/hr over 30 Minutes Intravenous On call to O.R. 05/07/16 0454 05/07/16 0903     and started on DVT  prophylaxis in the form of Xarelto.   PT and OT were ordered for total joint protocol.  Discharge planning consulted to help with postop disposition and equipment needs.  Patient had a great night on the evening of surgery.  They started to get up OOB with therapy on day one. Hemovac  drain was pulled without difficulty.  Continued to work with therapy into day two.  Dressing was changed on day two and the incision was clean and dry. The patient had progressed with therapy and meeting their goals.  Incision was healing well.  Patient was seen in rounds and was ready to go home.   Diet: Regular diet Activity:WBAT Follow-up:in 2 weeks Disposition - Home Discharged Condition: stable   Discharge Instructions    Call MD / Call 911    Complete by:  As directed   If you experience chest pain or shortness of breath, CALL 911 and be transported to the hospital emergency room.  If you develope a fever above 101 F, pus (white drainage) or increased drainage or redness at the wound, or calf pain, call your surgeon's office.     Constipation Prevention    Complete by:  As directed   Drink plenty of fluids.  Prune juice may be helpful.  You may use a stool softener, such as Colace (over the counter) 100 mg twice a day.  Use MiraLax (over the counter) for constipation as needed.     Diet general    Complete by:  As directed      Discharge instructions    Complete by:  As directed   Dr. Gaynelle Arabian Total Joint Specialist [facility] [address], [place], [place] [address] [phone]  TOTAL KNEE REPLACEMENT POSTOPERATIVE DIRECTIONS  Knee Rehabilitation, Guidelines Following Surgery  Results after knee surgery are often greatly improved when you follow the exercise, range of motion and muscle strengthening exercises prescribed by your doctor. Safety measures are also important to protect the knee from further injury. Any time any of these exercises cause you to have increased pain or swelling in your knee joint, decrease the amount until you are comfortable again and slowly increase them. If you have problems or questions, call your caregiver or physical therapist for advice.   HOME CARE INSTRUCTIONS  Remove items at home which could result in a fall.  This includes throw rugs or furniture in walking pathways.  ICE to the affected knee every three hours for 30 minutes at a time and then as needed for pain and swelling.  Continue to use ice on the knee for pain and swelling from surgery. You may notice swelling that will progress down to the foot and ankle.  This is normal after surgery.  Elevate the leg when you are not up walking on it.   Continue to use the breathing machine which will help keep your temperature down.  It is common for your temperature to cycle up and down following surgery, especially at night when you are not up moving around and exerting yourself.  The breathing machine keeps your lungs expanded and your temperature down. Do not place pillow under knee, focus on keeping the knee straight while resting  DIET You may resume your previous home diet once your are discharged from the hospital.  DRESSING / WOUND CARE / SHOWERING You may start showering once you are discharged home but do not submerge the incision under water. Just pat the incision dry and apply a dry gauze  dressing on daily. Change the surgical dressing daily and reapply a dry dressing each time.  ACTIVITY Walk with your walker as instructed. Use walker as long as suggested by your caregivers. Avoid periods of inactivity such as sitting longer than an hour when not asleep. This helps prevent blood clots.  You may resume a sexual relationship in one month or when given the OK by your doctor.  You may return to work once you are cleared by your doctor.  Do not drive a car for 6 weeks or until released by you surgeon.  Do not drive while taking narcotics.  WEIGHT BEARING Weight bearing as tolerated with assist device (walker, cane, etc) as directed, use it as long as suggested by your surgeon or therapist, typically at least 4-6 weeks.  POSTOPERATIVE CONSTIPATION PROTOCOL Constipation - defined medically as fewer than three stools per week and severe  constipation as less than one stool per week.  One of the most common issues patients have following surgery is constipation.  Even if you have a regular bowel pattern at home, your normal regimen is likely to be disrupted due to multiple reasons following surgery.  Combination of anesthesia, postoperative narcotics, change in appetite and fluid intake all can affect your bowels.  In order to avoid complications following surgery, here are some recommendations in order to help you during your recovery period.  Colace (docusate) - Pick up an over-the-counter form of Colace or another stool softener and take twice a day as long as you are requiring postoperative pain medications.  Take with a full glass of water daily.  If you experience loose stools or diarrhea, hold the colace until you stool forms back up.  If your symptoms do not get better within 1 week or if they get worse, check with your doctor.  Dulcolax (bisacodyl) - Pick up over-the-counter and take as directed by the product packaging as needed to assist with the movement of your bowels.  Take with a full glass of water.  Use this product as needed if not relieved by Colace only.   MiraLax (polyethylene glycol) - Pick up over-the-counter to have on hand.  MiraLax is a solution that will increase the amount of water in your bowels to assist with bowel movements.  Take as directed and can mix with a glass of water, juice, soda, coffee, or tea.  Take if you go more than two days without a movement. Do not use MiraLax more than once per day. Call your doctor if you are still constipated or irregular after using this medication for 7 days in a row.  If you continue to have problems with postoperative constipation, please contact the office for further assistance and recommendations.  If you experience "the worst abdominal pain ever" or develop nausea or vomiting, please contact the office immediatly for further recommendations for  treatment.  ITCHING  If you experience itching with your medications, try taking only a single pain pill, or even half a pain pill at a time.  You can also use Benadryl over the counter for itching or also to help with sleep.   TED HOSE STOCKINGS Wear the elastic stockings on both legs for three weeks following surgery during the day but you may remove then at night for sleeping.  MEDICATIONS See your medication summary on the "After Visit Summary" that the nursing staff will review with you prior to discharge.  You may have some home medications which will be placed on hold until  you complete the course of blood thinner medication.  It is important for you to complete the blood thinner medication as prescribed by your surgeon.  Continue your approved medications as instructed at time of discharge.  PRECAUTIONS If you experience chest pain or shortness of breath - call 911 immediately for transfer to the hospital emergency department.  If you develop a fever greater that 101 F, purulent drainage from wound, increased redness or drainage from wound, foul odor from the wound/dressing, or calf pain - CONTACT YOUR SURGEON.                                                   FOLLOW-UP APPOINTMENTS Make sure you keep all of your appointments after your operation with your surgeon and caregivers. You should call the office at the above phone number and make an appointment for approximately two weeks after the date of your surgery or on the date instructed by your surgeon outlined in the "After Visit Summary".   RANGE OF MOTION AND STRENGTHENING EXERCISES  Rehabilitation of the knee is important following a knee injury or an operation. After just a few days of immobilization, the muscles of the thigh which control the knee become weakened and shrink (atrophy). Knee exercises are designed to build up the tone and strength of the thigh muscles and to improve knee motion. Often times heat used for twenty to  thirty minutes before working out will loosen up your tissues and help with improving the range of motion but do not use heat for the first two weeks following surgery. These exercises can be done on a training (exercise) mat, on the floor, on a table or on a bed. Use what ever works the best and is most comfortable for you Knee exercises include:  Leg Lifts - While your knee is still immobilized in a splint or cast, you can do straight leg raises. Lift the leg to 60 degrees, hold for 3 sec, and slowly lower the leg. Repeat 10-20 times 2-3 times daily. Perform this exercise against resistance later as your knee gets better.  Quad and Hamstring Sets - Tighten up the muscle on the front of the thigh (Quad) and hold for 5-10 sec. Repeat this 10-20 times hourly. Hamstring sets are done by pushing the foot backward against an object and holding for 5-10 sec. Repeat as with quad sets.  Leg Slides: Lying on your back, slowly slide your foot toward your buttocks, bending your knee up off the floor (only go as far as is comfortable). Then slowly slide your foot back down until your leg is flat on the floor again. Angel Wings: Lying on your back spread your legs to the side as far apart as you can without causing discomfort.  A rehabilitation program following serious knee injuries can speed recovery and prevent re-injury in the future due to weakened muscles. Contact your doctor or a physical therapist for more information on knee rehabilitation.   IF YOU ARE TRANSFERRED TO A SKILLED REHAB FACILITY If the patient is transferred to a skilled rehab facility following release from the hospital, a list of the current medications will be sent to the facility for the patient to continue.  When discharged from the skilled rehab facility, please have the facility set up the patient's [place] prior to being released.  Also, the skilled facility will be responsible for providing the patient with their  medications at time of release from the facility to include their pain medication, the muscle relaxants, and their blood thinner medication. If the patient is still at the rehab facility at time of the two week follow up appointment, the skilled rehab facility will also need to assist the patient in arranging follow up appointment in our office and any transportation needs.  MAKE SURE YOU:  Understand these instructions.  Get help right away if you are not doing well or get worse.    Pick up stool softner and laxative for home use following surgery while on pain medications. Do not submerge incision under water. Please use good hand washing techniques while changing dressing each day. May shower starting three days after surgery. Please use a clean towel to pat the incision dry following showers. Continue to use ice for pain and swelling after surgery. Do not use any lotions or creams on the incision until instructed by your surgeon.     Increase activity slowly as tolerated    Complete by:  As directed             Medication List    STOP taking these medications        calcium-vitamin D 500-200 MG-UNIT tablet      TAKE these medications        levothyroxine 150 MCG tablet  Commonly known as:  SYNTHROID, LEVOTHROID  Take 150 mcg by mouth daily before breakfast.     methocarbamol 500 MG tablet  Commonly known as:  ROBAXIN  Take 1 tablet (500 mg total) by mouth every 6 (six) hours as needed for muscle spasms.     omeprazole 20 MG capsule  Commonly known as:  PRILOSEC  Take 20 mg by mouth 2 (two) times daily.     oxyCODONE 5 MG immediate release tablet  Commonly known as:  Oxy IR/ROXICODONE  Take 1-2 tablets (5-10 mg total) by mouth every 3 (three) hours as needed for breakthrough pain.     polyvinyl alcohol 1.4 % ophthalmic solution  Commonly known as:  LIQUIFILM TEARS  Place 1 drop into both eyes as needed for dry eyes.     rivaroxaban 10 MG Tabs tablet  Commonly  known as:  XARELTO  Take 1 tablet (10 mg total) by mouth daily with breakfast.     sertraline 100 MG tablet  Commonly known as:  ZOLOFT  Take 150 mg by mouth daily.     traMADol 50 MG tablet  Commonly known as:  ULTRAM  Take 1-2 tablets (50-100 mg total) by mouth every 6 (six) hours as needed for moderate pain.     valACYclovir 1000 MG tablet  Commonly known as:  VALTREX  Take 500 mg by mouth daily.           Follow-up Information    Follow up with Gearlean Alf, MD. Schedule an appointment as soon as possible for a visit on 05/24/2016.   Specialty:  Orthopedic Surgery   Contact information:   [address] [place] [address] [phone]       Follow up with [facility].   Why:  youth rolling walker and a 3n1 (over the commode seat)   Contact information:   [place] [address] [phone]       Signed: Ardeen Jourdain, PA-C Orthopaedic Surgery 05/09/2016, 9:00 AM

## 2017-12-23 DIAGNOSIS — M25542 Pain in joints of left hand: Secondary | ICD-10-CM | POA: Diagnosis not present

## 2017-12-23 DIAGNOSIS — M25541 Pain in joints of right hand: Secondary | ICD-10-CM | POA: Diagnosis not present

## 2018-03-04 DIAGNOSIS — E038 Other specified hypothyroidism: Secondary | ICD-10-CM | POA: Diagnosis not present

## 2018-03-06 DIAGNOSIS — H04123 Dry eye syndrome of bilateral lacrimal glands: Secondary | ICD-10-CM | POA: Diagnosis not present

## 2018-03-06 DIAGNOSIS — H2513 Age-related nuclear cataract, bilateral: Secondary | ICD-10-CM | POA: Diagnosis not present

## 2018-04-08 DIAGNOSIS — M5431 Sciatica, right side: Secondary | ICD-10-CM | POA: Diagnosis not present

## 2018-04-23 DIAGNOSIS — M5431 Sciatica, right side: Secondary | ICD-10-CM | POA: Diagnosis not present

## 2018-09-12 DIAGNOSIS — Z79899 Other long term (current) drug therapy: Secondary | ICD-10-CM | POA: Diagnosis not present

## 2018-09-12 DIAGNOSIS — Z2821 Immunization not carried out because of patient refusal: Secondary | ICD-10-CM | POA: Diagnosis not present

## 2018-09-12 DIAGNOSIS — M25542 Pain in joints of left hand: Secondary | ICD-10-CM | POA: Diagnosis not present

## 2018-09-12 DIAGNOSIS — M25541 Pain in joints of right hand: Secondary | ICD-10-CM | POA: Diagnosis not present

## 2018-09-17 DIAGNOSIS — L820 Inflamed seborrheic keratosis: Secondary | ICD-10-CM | POA: Diagnosis not present

## 2018-09-17 DIAGNOSIS — L300 Nummular dermatitis: Secondary | ICD-10-CM | POA: Diagnosis not present

## 2018-09-17 DIAGNOSIS — L918 Other hypertrophic disorders of the skin: Secondary | ICD-10-CM | POA: Diagnosis not present

## 2018-10-09 DIAGNOSIS — M19042 Primary osteoarthritis, left hand: Secondary | ICD-10-CM | POA: Diagnosis not present

## 2018-10-09 DIAGNOSIS — M19041 Primary osteoarthritis, right hand: Secondary | ICD-10-CM | POA: Diagnosis not present

## 2018-10-09 DIAGNOSIS — M19049 Primary osteoarthritis, unspecified hand: Secondary | ICD-10-CM | POA: Insufficient documentation

## 2018-10-30 DIAGNOSIS — J40 Bronchitis, not specified as acute or chronic: Secondary | ICD-10-CM | POA: Diagnosis not present

## 2018-10-30 DIAGNOSIS — J329 Chronic sinusitis, unspecified: Secondary | ICD-10-CM | POA: Diagnosis not present

## 2018-12-10 DIAGNOSIS — G4733 Obstructive sleep apnea (adult) (pediatric): Secondary | ICD-10-CM | POA: Diagnosis not present

## 2019-02-02 DIAGNOSIS — S8392XA Sprain of unspecified site of left knee, initial encounter: Secondary | ICD-10-CM | POA: Diagnosis not present

## 2019-02-02 DIAGNOSIS — M159 Polyosteoarthritis, unspecified: Secondary | ICD-10-CM | POA: Diagnosis not present

## 2019-02-25 DIAGNOSIS — M159 Polyosteoarthritis, unspecified: Secondary | ICD-10-CM | POA: Diagnosis not present

## 2019-02-25 DIAGNOSIS — M1712 Unilateral primary osteoarthritis, left knee: Secondary | ICD-10-CM | POA: Diagnosis not present

## 2019-02-25 DIAGNOSIS — S8392XD Sprain of unspecified site of left knee, subsequent encounter: Secondary | ICD-10-CM | POA: Diagnosis not present

## 2019-03-10 DIAGNOSIS — E038 Other specified hypothyroidism: Secondary | ICD-10-CM | POA: Diagnosis not present

## 2019-03-12 DIAGNOSIS — G4733 Obstructive sleep apnea (adult) (pediatric): Secondary | ICD-10-CM | POA: Diagnosis not present

## 2019-03-17 DIAGNOSIS — M25562 Pain in left knee: Secondary | ICD-10-CM | POA: Insufficient documentation

## 2019-05-07 DIAGNOSIS — M65312 Trigger thumb, left thumb: Secondary | ICD-10-CM | POA: Insufficient documentation

## 2019-11-11 ENCOUNTER — Encounter (HOSPITAL_BASED_OUTPATIENT_CLINIC_OR_DEPARTMENT_OTHER): Payer: Self-pay | Admitting: Orthopedic Surgery

## 2019-11-11 ENCOUNTER — Other Ambulatory Visit: Payer: Self-pay

## 2019-11-16 ENCOUNTER — Other Ambulatory Visit (HOSPITAL_COMMUNITY)
Admission: RE | Admit: 2019-11-16 | Discharge: 2019-11-16 | Disposition: A | Discharge status: Home / Self Care | Payer: BC Managed Care – PPO | Source: Ambulatory Visit | Attending: Orthopedic Surgery | Admitting: Orthopedic Surgery

## 2019-11-16 DIAGNOSIS — Z20828 Contact with and (suspected) exposure to other viral communicable diseases: Secondary | ICD-10-CM | POA: Diagnosis not present

## 2019-11-16 DIAGNOSIS — Z01812 Encounter for preprocedural laboratory examination: Secondary | ICD-10-CM | POA: Diagnosis present

## 2019-11-17 ENCOUNTER — Encounter (HOSPITAL_BASED_OUTPATIENT_CLINIC_OR_DEPARTMENT_OTHER)
Admission: RE | Admit: 2019-11-17 | Discharge: 2019-11-17 | Disposition: A | Discharge status: Home / Self Care | Payer: BC Managed Care – PPO | Source: Ambulatory Visit | Attending: Orthopedic Surgery | Admitting: Orthopedic Surgery

## 2019-11-17 ENCOUNTER — Other Ambulatory Visit (HOSPITAL_COMMUNITY): Payer: BC Managed Care – PPO

## 2019-11-17 ENCOUNTER — Other Ambulatory Visit: Payer: Self-pay

## 2019-11-17 DIAGNOSIS — F329 Major depressive disorder, single episode, unspecified: Secondary | ICD-10-CM | POA: Diagnosis not present

## 2019-11-17 DIAGNOSIS — G4730 Sleep apnea, unspecified: Secondary | ICD-10-CM | POA: Diagnosis not present

## 2019-11-17 DIAGNOSIS — M1990 Unspecified osteoarthritis, unspecified site: Secondary | ICD-10-CM | POA: Diagnosis not present

## 2019-11-17 DIAGNOSIS — Z885 Allergy status to narcotic agent status: Secondary | ICD-10-CM | POA: Diagnosis not present

## 2019-11-17 DIAGNOSIS — Z79899 Other long term (current) drug therapy: Secondary | ICD-10-CM | POA: Diagnosis not present

## 2019-11-17 DIAGNOSIS — M75102 Unspecified rotator cuff tear or rupture of left shoulder, not specified as traumatic: Secondary | ICD-10-CM | POA: Diagnosis not present

## 2019-11-17 DIAGNOSIS — W19XXXA Unspecified fall, initial encounter: Secondary | ICD-10-CM | POA: Diagnosis not present

## 2019-11-17 DIAGNOSIS — K219 Gastro-esophageal reflux disease without esophagitis: Secondary | ICD-10-CM | POA: Diagnosis not present

## 2019-11-17 DIAGNOSIS — Z79890 Hormone replacement therapy (postmenopausal): Secondary | ICD-10-CM | POA: Diagnosis not present

## 2019-11-17 DIAGNOSIS — E039 Hypothyroidism, unspecified: Secondary | ICD-10-CM | POA: Diagnosis not present

## 2019-11-17 DIAGNOSIS — M7542 Impingement syndrome of left shoulder: Secondary | ICD-10-CM | POA: Diagnosis not present

## 2019-11-17 DIAGNOSIS — Z96651 Presence of right artificial knee joint: Secondary | ICD-10-CM | POA: Diagnosis not present

## 2019-11-17 DIAGNOSIS — S46212A Strain of muscle, fascia and tendon of other parts of biceps, left arm, initial encounter: Secondary | ICD-10-CM | POA: Diagnosis present

## 2019-11-17 LAB — CBC
HCT: 41.3 % (ref 36.0–46.0)
Hemoglobin: 14.3 g/dL (ref 12.0–15.0)
MCH: 31.8 pg (ref 26.0–34.0)
MCHC: 34.6 g/dL (ref 30.0–36.0)
MCV: 91.8 fL (ref 80.0–100.0)
Platelets: 186 10*3/uL (ref 150–400)
RBC: 4.5 MIL/uL (ref 3.87–5.11)
RDW: 13.2 % (ref 11.5–15.5)
WBC: 6.3 10*3/uL (ref 4.0–10.5)
nRBC: 0 % (ref 0.0–0.2)

## 2019-11-17 LAB — BASIC METABOLIC PANEL
Anion gap: 11 (ref 5–15)
BUN: 18 mg/dL (ref 8–23)
CO2: 23 mmol/L (ref 22–32)
Calcium: 9.2 mg/dL (ref 8.9–10.3)
Chloride: 106 mmol/L (ref 98–111)
Creatinine, Ser: 0.82 mg/dL (ref 0.44–1.00)
GFR calc Af Amer: >60 mL/min (ref >60)
GFR calc non Af Amer: >60 mL/min (ref >60)
Glucose, Bld: 111 mg/dL — ABNORMAL HIGH (ref 70–99)
Potassium: 4.5 mmol/L (ref 3.5–5.1)
Sodium: 140 mmol/L (ref 135–145)

## 2019-11-17 LAB — NOVEL CORONAVIRUS, NAA (HOSP ORDER, SEND-OUT TO REF LAB; TAT 18-24 HRS): SARS-CoV-2, NAA: NOT DETECTED

## 2019-11-17 NOTE — Progress Notes (Signed)

## 2019-11-18 NOTE — Anesthesia Preprocedure Evaluation (Signed)
Anesthesia Evaluation  Patient identified by MRN, date of birth, ID band Patient awake    Reviewed: Allergy & Precautions, NPO status , Patient's Chart, lab work & pertinent test results  History of Anesthesia Complications (+) PONV, Family history of anesthesia reaction and history of anesthetic complications  Airway Mallampati: II  TM Distance: >3 FB Neck ROM: Full    Dental no notable dental hx.    Pulmonary sleep apnea and Continuous Positive Airway Pressure Ventilation ,    Pulmonary exam normal breath sounds clear to auscultation       Cardiovascular negative cardio ROS Normal cardiovascular exam Rhythm:Regular Rate:Normal     Neuro/Psych negative neurological ROS  negative psych ROS   GI/Hepatic Neg liver ROS, GERD  Medicated,  Endo/Other  Hypothyroidism Morbid obesity  Renal/GU negative Renal ROS  negative genitourinary   Musculoskeletal negative musculoskeletal ROS (+)   Abdominal (+) + obese,   Peds negative pediatric ROS (+)  Hematology  (+) Blood dyscrasia, anemia ,   Anesthesia Other Findings   Reproductive/Obstetrics negative OB ROS                             Anesthesia Physical  Anesthesia Plan  ASA: II  Anesthesia Plan: General   Post-op Pain Management: GA combined w/ Regional for post-op pain   Induction: Intravenous  PONV Risk Score and Plan: 4 or greater and Ondansetron, Dexamethasone, Treatment may vary due to age or medical condition, Midazolam and Scopolamine patch - Pre-op  Airway Management Planned: Oral ETT and LMA  Additional Equipment:   Intra-op Plan:   Post-operative Plan: Extubation in OR  Informed Consent: I have reviewed the patients History and Physical, chart, labs and discussed the procedure including the risks, benefits and alternatives for the proposed anesthesia with the patient or authorized representative who has indicated his/her  understanding and acceptance.     Dental advisory given  Plan Discussed with: CRNA, Anesthesiologist and Surgeon  Anesthesia Plan Comments: (  )        Anesthesia Quick Evaluation

## 2019-11-19 ENCOUNTER — Ambulatory Visit (HOSPITAL_BASED_OUTPATIENT_CLINIC_OR_DEPARTMENT_OTHER)
Admission: RE | Admit: 2019-11-19 | Discharge: 2019-11-19 | Disposition: A | Discharge status: Home / Self Care | Payer: BC Managed Care – PPO | Attending: Orthopedic Surgery | Admitting: Orthopedic Surgery

## 2019-11-19 ENCOUNTER — Other Ambulatory Visit: Payer: Self-pay

## 2019-11-19 ENCOUNTER — Encounter (HOSPITAL_BASED_OUTPATIENT_CLINIC_OR_DEPARTMENT_OTHER): Payer: Self-pay | Admitting: Orthopedic Surgery

## 2019-11-19 ENCOUNTER — Ambulatory Visit (HOSPITAL_BASED_OUTPATIENT_CLINIC_OR_DEPARTMENT_OTHER): Payer: BC Managed Care – PPO | Admitting: Anesthesiology

## 2019-11-19 ENCOUNTER — Encounter (HOSPITAL_BASED_OUTPATIENT_CLINIC_OR_DEPARTMENT_OTHER)
Admission: RE | Disposition: A | Discharge status: Home / Self Care | Payer: Self-pay | Source: Home / Self Care | Attending: Orthopedic Surgery

## 2019-11-19 DIAGNOSIS — S46212A Strain of muscle, fascia and tendon of other parts of biceps, left arm, initial encounter: Secondary | ICD-10-CM | POA: Diagnosis not present

## 2019-11-19 DIAGNOSIS — K219 Gastro-esophageal reflux disease without esophagitis: Secondary | ICD-10-CM | POA: Insufficient documentation

## 2019-11-19 DIAGNOSIS — E039 Hypothyroidism, unspecified: Secondary | ICD-10-CM | POA: Insufficient documentation

## 2019-11-19 DIAGNOSIS — M1990 Unspecified osteoarthritis, unspecified site: Secondary | ICD-10-CM | POA: Insufficient documentation

## 2019-11-19 DIAGNOSIS — W19XXXA Unspecified fall, initial encounter: Secondary | ICD-10-CM | POA: Insufficient documentation

## 2019-11-19 DIAGNOSIS — Z885 Allergy status to narcotic agent status: Secondary | ICD-10-CM | POA: Insufficient documentation

## 2019-11-19 DIAGNOSIS — F329 Major depressive disorder, single episode, unspecified: Secondary | ICD-10-CM | POA: Insufficient documentation

## 2019-11-19 DIAGNOSIS — G4730 Sleep apnea, unspecified: Secondary | ICD-10-CM | POA: Insufficient documentation

## 2019-11-19 DIAGNOSIS — M75102 Unspecified rotator cuff tear or rupture of left shoulder, not specified as traumatic: Secondary | ICD-10-CM | POA: Insufficient documentation

## 2019-11-19 DIAGNOSIS — Z96651 Presence of right artificial knee joint: Secondary | ICD-10-CM | POA: Insufficient documentation

## 2019-11-19 DIAGNOSIS — M7542 Impingement syndrome of left shoulder: Secondary | ICD-10-CM | POA: Insufficient documentation

## 2019-11-19 DIAGNOSIS — Z79899 Other long term (current) drug therapy: Secondary | ICD-10-CM | POA: Insufficient documentation

## 2019-11-19 DIAGNOSIS — Z79890 Hormone replacement therapy (postmenopausal): Secondary | ICD-10-CM | POA: Insufficient documentation

## 2019-11-19 HISTORY — PX: SHOULDER ACROMIOPLASTY: SHX6093

## 2019-11-19 HISTORY — PX: IRRIGATION AND DEBRIDEMENT SHOULDER: SHX5880

## 2019-11-19 HISTORY — PX: SHOULDER ARTHROSCOPY WITH ROTATOR CUFF REPAIR AND OPEN BICEPS TENODESIS: SHX6677

## 2019-11-19 SURGERY — SHOULDER ACROMIOPLASTY
Anesthesia: General | Site: Shoulder | Laterality: Left

## 2019-11-19 MED ORDER — POVIDONE-IODINE 10 % EX SWAB
2.0000 "application " | Freq: Once | CUTANEOUS | Status: AC
  Administered 2019-11-19: 2 via TOPICAL

## 2019-11-19 MED ORDER — LIDOCAINE 2% (20 MG/ML) 5 ML SYRINGE
INTRAMUSCULAR | Status: DC | PRN
  Administered 2019-11-19: 40 mg via INTRAVENOUS

## 2019-11-19 MED ORDER — BACLOFEN 10 MG PO TABS: 10.0000 mg | ORAL_TABLET | Freq: Three times a day (TID) | ORAL | 0 refills | Status: DC

## 2019-11-19 MED ORDER — ACETAMINOPHEN 500 MG PO TABS
ORAL_TABLET | ORAL | Status: AC
  Filled 2019-11-19: qty 2

## 2019-11-19 MED ORDER — SUGAMMADEX SODIUM 200 MG/2ML IV SOLN
INTRAVENOUS | Status: DC | PRN
  Administered 2019-11-19: 200 mg via INTRAVENOUS

## 2019-11-19 MED ORDER — ONDANSETRON HCL 4 MG/2ML IJ SOLN
INTRAMUSCULAR | Status: DC | PRN
  Administered 2019-11-19: 4 mg via INTRAVENOUS

## 2019-11-19 MED ORDER — ROCURONIUM BROMIDE 50 MG/5ML IV SOSY
PREFILLED_SYRINGE | INTRAVENOUS | Status: DC | PRN
  Administered 2019-11-19: 70 mg via INTRAVENOUS

## 2019-11-19 MED ORDER — BUPIVACAINE LIPOSOME 1.3 % IJ SUSP
INTRAMUSCULAR | Status: DC | PRN
  Administered 2019-11-19: 10 mg

## 2019-11-19 MED ORDER — PROPOFOL 10 MG/ML IV BOLUS
INTRAVENOUS | Status: DC | PRN
  Administered 2019-11-19: 160 mg via INTRAVENOUS

## 2019-11-19 MED ORDER — MIDAZOLAM HCL 2 MG/2ML IJ SOLN
INTRAMUSCULAR | Status: AC
  Filled 2019-11-19: qty 2

## 2019-11-19 MED ORDER — FENTANYL CITRATE (PF) 100 MCG/2ML IJ SOLN
50.0000 ug | INTRAMUSCULAR | Status: DC | PRN
  Administered 2019-11-19: 07:00:00 50 ug via INTRAVENOUS

## 2019-11-19 MED ORDER — ROCURONIUM BROMIDE 10 MG/ML (PF) SYRINGE
PREFILLED_SYRINGE | INTRAVENOUS | Status: AC
  Filled 2019-11-19: qty 10

## 2019-11-19 MED ORDER — FENTANYL CITRATE (PF) 100 MCG/2ML IJ SOLN: 25.0000 ug | INTRAMUSCULAR | Status: DC | PRN

## 2019-11-19 MED ORDER — CHLORHEXIDINE GLUCONATE 4 % EX LIQD: 60.0000 mL | Freq: Once | CUTANEOUS | Status: DC

## 2019-11-19 MED ORDER — OXYCODONE HCL 5 MG PO TABS: 5.0000 mg | ORAL_TABLET | Freq: Once | ORAL | Status: DC | PRN

## 2019-11-19 MED ORDER — LACTATED RINGERS IV SOLN: INTRAVENOUS | Status: DC

## 2019-11-19 MED ORDER — SENNA-DOCUSATE SODIUM 8.6-50 MG PO TABS: 2.0000 | ORAL_TABLET | Freq: Every day | ORAL | 1 refills | Status: DC

## 2019-11-19 MED ORDER — ONDANSETRON HCL 4 MG/2ML IJ SOLN
INTRAMUSCULAR | Status: AC
  Filled 2019-11-19: qty 2

## 2019-11-19 MED ORDER — ONDANSETRON HCL 4 MG PO TABS: 4.0000 mg | ORAL_TABLET | Freq: Three times a day (TID) | ORAL | 0 refills | Status: DC | PRN

## 2019-11-19 MED ORDER — ACETAMINOPHEN 325 MG PO TABS: 325.0000 mg | ORAL_TABLET | ORAL | Status: DC | PRN

## 2019-11-19 MED ORDER — MIDAZOLAM HCL 2 MG/2ML IJ SOLN
1.0000 mg | INTRAMUSCULAR | Status: DC | PRN
  Administered 2019-11-19: 07:00:00 2 mg via INTRAVENOUS

## 2019-11-19 MED ORDER — LIDOCAINE 2% (20 MG/ML) 5 ML SYRINGE
INTRAMUSCULAR | Status: AC
  Filled 2019-11-19: qty 5

## 2019-11-19 MED ORDER — DEXAMETHASONE SODIUM PHOSPHATE 10 MG/ML IJ SOLN
INTRAMUSCULAR | Status: AC
  Filled 2019-11-19: qty 1

## 2019-11-19 MED ORDER — ACETAMINOPHEN 500 MG PO TABS
1000.0000 mg | ORAL_TABLET | Freq: Once | ORAL | Status: AC
  Administered 2019-11-19: 1000 mg via ORAL

## 2019-11-19 MED ORDER — BUPIVACAINE HCL 0.25 % IJ SOLN
INTRAMUSCULAR | Status: DC | PRN
  Administered 2019-11-19: 20 mg

## 2019-11-19 MED ORDER — EPHEDRINE SULFATE-NACL 50-0.9 MG/10ML-% IV SOSY
PREFILLED_SYRINGE | INTRAVENOUS | Status: DC | PRN
  Administered 2019-11-19: 10 mg via INTRAVENOUS
  Administered 2019-11-19 (×2): 5 mg via INTRAVENOUS
  Administered 2019-11-19 (×2): 10 mg via INTRAVENOUS
  Administered 2019-11-19 (×2): 5 mg via INTRAVENOUS

## 2019-11-19 MED ORDER — SODIUM CHLORIDE 0.9 % IR SOLN
Status: DC | PRN
  Administered 2019-11-19: 6000 mL

## 2019-11-19 MED ORDER — OXYCODONE HCL 5 MG/5ML PO SOLN: 5.0000 mg | Freq: Once | ORAL | Status: DC | PRN

## 2019-11-19 MED ORDER — SCOPOLAMINE 1 MG/3DAYS TD PT72
MEDICATED_PATCH | TRANSDERMAL | Status: AC
  Filled 2019-11-19: qty 1

## 2019-11-19 MED ORDER — FENTANYL CITRATE (PF) 100 MCG/2ML IJ SOLN
INTRAMUSCULAR | Status: AC
  Filled 2019-11-19: qty 2

## 2019-11-19 MED ORDER — HYDROCODONE-ACETAMINOPHEN 10-325 MG PO TABS: 1.0000 | ORAL_TABLET | Freq: Four times a day (QID) | ORAL | 0 refills | Status: DC | PRN

## 2019-11-19 MED ORDER — DEXAMETHASONE SODIUM PHOSPHATE 10 MG/ML IJ SOLN
INTRAMUSCULAR | Status: DC | PRN
  Administered 2019-11-19: 10 mg via INTRAVENOUS

## 2019-11-19 MED ORDER — CEFAZOLIN SODIUM-DEXTROSE 2-4 GM/100ML-% IV SOLN
2.0000 g | INTRAVENOUS | Status: AC
  Administered 2019-11-19: 08:00:00 2 g via INTRAVENOUS

## 2019-11-19 MED ORDER — ACETAMINOPHEN 160 MG/5ML PO SOLN: 325.0000 mg | ORAL | Status: DC | PRN

## 2019-11-19 MED ORDER — PHENYLEPHRINE 40 MCG/ML (10ML) SYRINGE FOR IV PUSH (FOR BLOOD PRESSURE SUPPORT)
PREFILLED_SYRINGE | INTRAVENOUS | Status: AC
  Filled 2019-11-19: qty 10

## 2019-11-19 MED ORDER — ONDANSETRON HCL 4 MG/2ML IJ SOLN: 4.0000 mg | Freq: Once | INTRAMUSCULAR | Status: DC | PRN

## 2019-11-19 MED ORDER — CEFAZOLIN SODIUM-DEXTROSE 2-4 GM/100ML-% IV SOLN
INTRAVENOUS | Status: AC
  Filled 2019-11-19: qty 100

## 2019-11-19 MED ORDER — MEPERIDINE HCL 25 MG/ML IJ SOLN: 6.2500 mg | INTRAMUSCULAR | Status: DC | PRN

## 2019-11-19 MED ORDER — PHENYLEPHRINE 40 MCG/ML (10ML) SYRINGE FOR IV PUSH (FOR BLOOD PRESSURE SUPPORT)
PREFILLED_SYRINGE | INTRAVENOUS | Status: DC | PRN
  Administered 2019-11-19 (×2): 80 ug via INTRAVENOUS

## 2019-11-19 SURGICAL SUPPLY — 63 items
ANCHOR SUT BIO SW 4.75X19.1 (Anchor) ×6 IMPLANT
BLADE EXCALIBUR 4.0X13 (MISCELLANEOUS) IMPLANT
BLADE SURG 15 STRL LF DISP TIS (BLADE) IMPLANT
BLADE SURG 15 STRL SS (BLADE)
BURR OVAL 8 FLU 4.0X13 (MISCELLANEOUS) ×2 IMPLANT
BURR OVAL 8 FLU 5.0X13 (MISCELLANEOUS) IMPLANT
CANNULA 5.75X71 LONG (CANNULA) ×2 IMPLANT
CANNULA TWIST IN 8.25X7CM (CANNULA) ×4 IMPLANT
CLSR STERI-STRIP ANTIMIC 1/2X4 (GAUZE/BANDAGES/DRESSINGS) ×2 IMPLANT
COVER WAND RF STERILE (DRAPES) IMPLANT
DECANTER SPIKE VIAL GLASS SM (MISCELLANEOUS) IMPLANT
DISSECTOR  3.8MM X 13CM (MISCELLANEOUS) ×1
DISSECTOR 3.8MM X 13CM (MISCELLANEOUS) ×1 IMPLANT
DRAPE HALF SHEET 70X43 (DRAPES) ×2 IMPLANT
DRAPE IMP U-DRAPE 54X76 (DRAPES) ×4 IMPLANT
DRAPE INCISE IOBAN 66X45 STRL (DRAPES) ×2 IMPLANT
DRAPE SHOULDER BEACH CHAIR (DRAPES) ×2 IMPLANT
DRAPE SURG 17X23 STRL (DRAPES) ×2 IMPLANT
DRAPE U-SHAPE 47X51 STRL (DRAPES) ×2 IMPLANT
DRSG PAD ABDOMINAL 8X10 ST (GAUZE/BANDAGES/DRESSINGS) ×2 IMPLANT
DURAPREP 26ML APPLICATOR (WOUND CARE) ×2 IMPLANT
ELECT REM PT RETURN 9FT ADLT (ELECTROSURGICAL)
ELECTRODE REM PT RTRN 9FT ADLT (ELECTROSURGICAL) IMPLANT
FIBERSTICK 2 (SUTURE) IMPLANT
GAUZE SPONGE 4X4 12PLY STRL (GAUZE/BANDAGES/DRESSINGS) ×2 IMPLANT
GLOVE BIO SURGEON STRL SZ 6.5 (GLOVE) ×4 IMPLANT
GLOVE BIO SURGEON STRL SZ7 (GLOVE) ×2 IMPLANT
GLOVE BIOGEL PI IND STRL 7.0 (GLOVE) ×2 IMPLANT
GLOVE BIOGEL PI IND STRL 8 (GLOVE) ×4 IMPLANT
GLOVE BIOGEL PI INDICATOR 7.0 (GLOVE) ×2
GLOVE BIOGEL PI INDICATOR 8 (GLOVE) ×4
GLOVE ORTHO TXT STRL SZ7.5 (GLOVE) ×2 IMPLANT
GOWN STRL REUS W/ TWL LRG LVL3 (GOWN DISPOSABLE) ×1 IMPLANT
GOWN STRL REUS W/ TWL XL LVL3 (GOWN DISPOSABLE) ×2 IMPLANT
GOWN STRL REUS W/TWL LRG LVL3 (GOWN DISPOSABLE) ×1
GOWN STRL REUS W/TWL XL LVL3 (GOWN DISPOSABLE) ×2
IMMOBILIZER SHOULDER FOAM XLGE (SOFTGOODS) IMPLANT
IMPL SPEEDBRIDGE KIT (Orthopedic Implant) ×1 IMPLANT
IMPLANT SPEEDBRIDGE KIT (Orthopedic Implant) ×2 IMPLANT
LASSO 90 CVE QUICKPAS (DISPOSABLE) ×2 IMPLANT
MANIFOLD NEPTUNE II (INSTRUMENTS) ×2 IMPLANT
NEEDLE SCORPION MULTI FIRE (NEEDLE) ×4 IMPLANT
PACK ARTHROSCOPY DSU (CUSTOM PROCEDURE TRAY) ×2 IMPLANT
PACK BASIN DAY SURGERY FS (CUSTOM PROCEDURE TRAY) ×2 IMPLANT
PORT APPOLLO RF 90DEGREE MULTI (SURGICAL WAND) ×2 IMPLANT
SLEEVE SCD COMPRESS KNEE MED (MISCELLANEOUS) ×2 IMPLANT
SLING ARM FOAM STRAP LRG (SOFTGOODS) IMPLANT
SUPPORT WRAP ARM LG (MISCELLANEOUS) ×2 IMPLANT
SUT FIBERWIRE #2 38 T-5 BLUE (SUTURE)
SUT MNCRL AB 4-0 PS2 18 (SUTURE) ×2 IMPLANT
SUT PDS AB 0 CT 36 (SUTURE) ×2 IMPLANT
SUT PDS AB 1 CT  36 (SUTURE)
SUT PDS AB 1 CT 36 (SUTURE) IMPLANT
SUT TIGER TAPE 7 IN WHITE (SUTURE) IMPLANT
SUT VIC AB 3-0 SH 27 (SUTURE)
SUT VIC AB 3-0 SH 27X BRD (SUTURE) IMPLANT
SUT VICRYL 3-0 CR8 SH (SUTURE) ×2 IMPLANT
SUTURE FIBERWR #2 38 T-5 BLUE (SUTURE) IMPLANT
TAPE FIBER 2MM 7IN #2 BLUE (SUTURE) IMPLANT
TOWEL GREEN STERILE FF (TOWEL DISPOSABLE) ×2 IMPLANT
TUBE CONNECTING 20X1/4 (TUBING) ×2 IMPLANT
TUBING ARTHROSCOPY IRRIG 16FT (MISCELLANEOUS) ×2 IMPLANT
WATER STERILE IRR 1000ML POUR (IV SOLUTION) ×2 IMPLANT

## 2019-11-19 NOTE — Op Note (Signed)
11/19/2019  10:51 AM  PATIENT:  Kristin [name]    PRE-OPERATIVE DIAGNOSIS: Left shoulder biceps dislocation, supraspinatus tear, subscapularis tear, infraspinatus tear, with impingement syndrome  POST-OPERATIVE DIAGNOSIS: Left shoulder biceps rupture, subscapularis tear, infraspinatus tear, supraspinatus tear, impingement syndrome, labral tear  PROCEDURE: Left shoulder arthroscopy with extensive debridement, removal of biceps tendon stump, subscapularis repair, supraspinatus repair, infraspinatus repair, acromioplasy  SURGEON:  Eulas Post, MD  PHYSICIAN ASSISTANT: Janine Ores, PA-C, present and scrubbed throughout the case, critical for completion in a timely fashion, and for retraction, instrumentation, and closure.  ANESTHESIA:   General with regional block using Exparel  PREOPERATIVE INDICATIONS:  Kristin [name] is a  63 y.o. female with significant shoulder pain who failed conservative measures and elected for surgical management.  She had a known chronic rotator cuff tear with acute on chronic symptoms.  The risks benefits and alternatives were discussed with the patient preoperatively including but not limited to the risks of infection, bleeding, nerve injury, cardiopulmonary complications, the need for revision surgery, among others, and the patient was willing to proceed.  ESTIMATED BLOOD LOSS: 50 mL  OPERATIVE IMPLANTS: Arthrex Biocomposite SwiveLock 4.75 mm x 1 for the subscapularis with a fiber tape and I also used the rescue stitch to augment the repair securing the upper border of the tendon back to bone.  I used a total of 3 medial 4.75 millimeters swivel locks for the medial row preloaded with fibertape, and 2 lateral Biocomposite SwiveLock 4.75 mm anchors for the lateral row in a SpeedBridge configuration.  OPERATIVE FINDINGS: The biceps tendon was ruptured, the subscapularis was torn and retracted, glenohumeral articular cartilage was in reasonably good  condition, there was circumferential labral fraying.  There was a massive rotator cuff tear involving the supraspinatus and infraspinatus as well as the subscapularis.  Tendon quality was mediocre, bone quality was somewhat poor.  In fact the anterior anchor I abandoned my initial hole, because it did not have any good feel, and I placed the hole a little bit more posterior and got better bone.  OPERATIVE PROCEDURE: The patient was brought to the operating room and placed in the supine position.  General anesthesia was administered and the patient positioned in a beach chair position.  IV antibiotics were given.  The upper extremity was prepped and draped in the usual sterile fashion.  Timeout performed.  Diagnostic arthroscopy was carried out with the above named findings.    The glenoid labrum was debrided anteriorly and superiorly.    I made an anterior superior portal, and grabbed the biceps stump with a grasper, and then resected the stump at its base using a basket.  I then debrided the subscapularis, prepared a bed for reinsertion using a bur, and an anterior working cannula, passed a fiber tape through the tendon using a curved Suture Passer.   I then secured the tendon to bone and then used the rescue suture #2 FiberWire to augment the repair with a simple stitch configuration across the upper border.  I debrided the undersurface of the cuff as well as prepared the medial edge of the tuberosity for reimplantation using the shaver.  I went to the subacromial space, performed a complete bursectomy, subacromial CA ligament release, with a acromioplasty.  I evaluated the tear from viewing laterally, used the shaver from posterior laterally, debrided the tear as well as the bony footprint, and prepared the tendon for reinsertion.  I placed 3 anchors from above using an anterior and posterior  portal with percutaneous placement for the medial row preloaded with fiber tape in each anchor.  I  passed the sutures using a scorpion suture passer from front to back, and then brought these into lateral anchors.  During placement of the posterior anchor, the eyelid fractured, but I was able to retrieve all of the parts, and then proceeded with definitive fixation of the lateral row.    During placement of the anterior anchor, there was very poor bone quality and so I made 1 pass with the punch, but abandon that and went and replaced another hole slightly more posteriorly.  This had good fixation.    Excellent fixation and reduction of the tendon was achieved.  I touched up the acromioplasty viewing from lateral portal.  The instruments were removed, the portals closed with Monocryl followed by Steri-Strips and sterile gauze.  The patient was awakened and returned to the PACU in stable and satisfactory condition.  There were no complications and She tolerated the procedure well.

## 2019-11-19 NOTE — Anesthesia Procedure Notes (Signed)
Anesthesia Regional Block: Interscalene brachial plexus block   Pre-Anesthetic Checklist: ,, timeout performed, Correct Patient, Correct Site, Correct Laterality, Correct Procedure, Correct Position, site marked, Risks and benefits discussed,  Surgical consent,  Pre-op evaluation,  At surgeon's request and post-op pain management  Laterality: Left  Prep: chloraprep       Needles:  Injection technique: Single-shot  Needle Type: Echogenic Stimulator Needle     Needle Length: 5cm  Needle Gauge: 22     Additional Needles:   Procedures:, nerve stimulator,,, ultrasound used (permanent image in chart),,,,   Nerve Stimulator or Paresthesia:  Response: hand, 0.45 mA,   Additional Responses:   Narrative:  Start time: 11/19/2019 7:15 AM End time: 11/19/2019 7:20 AM Injection made incrementally with aspirations every 5 mL.  Performed by: Personally  Anesthesiologist: Janeece Riggers, MD  Additional Notes: Functioning IV was confirmed and monitors were applied.  A 33mm 22ga Arrow echogenic stimulator needle was used. Sterile prep and drape,hand hygiene and sterile gloves were used. Ultrasound guidance: relevant anatomy identified, needle position confirmed, local anesthetic spread visualized around nerve(s)., vascular puncture avoided.  Image printed for medical record. Negative aspiration and negative test dose prior to incremental administration of local anesthetic. The patient tolerated the procedure well.

## 2019-11-19 NOTE — Anesthesia Procedure Notes (Signed)
Procedure Name: Intubation Date/Time: 11/19/2019 7:39 AM Performed by: Genelle Bal, CRNA Pre-anesthesia Checklist: Patient identified, Emergency Drugs available, Suction available and Patient being monitored Patient Re-evaluated:Patient Re-evaluated prior to induction Oxygen Delivery Method: Circle system utilized Preoxygenation: Pre-oxygenation with 100% oxygen Induction Type: IV induction Ventilation: Mask ventilation without difficulty and Oral airway inserted - appropriate to patient size Laryngoscope Size: Sabra Heck and 2 Grade View: Grade I Tube type: Oral Tube size: 7.0 mm Number of attempts: 1 Airway Equipment and Method: Stylet and Oral airway Placement Confirmation: ETT inserted through vocal cords under direct vision,  positive ETCO2 and breath sounds checked- equal and bilateral Secured at: 20 cm Tube secured with: Tape Dental Injury: Teeth and Oropharynx as per pre-operative assessment

## 2019-11-19 NOTE — Anesthesia Postprocedure Evaluation (Signed)
Anesthesia Post Note  Patient: Kristin [name]  Procedure(s) Performed: SHOULDER ACROMIOPLASTY (Left Shoulder) SHOULDER ARTHROSCOPY WITH ROTATOR CUFF REPAIR AND  BICEPS TENODESIS (Left Shoulder) IRRIGATION AND DEBRIDEMENT SHOULDER (Left Shoulder)     Patient location during evaluation: PACU Anesthesia Type: General Level of consciousness: awake and alert Pain management: pain level controlled Vital Signs Assessment: post-procedure vital signs reviewed and stable Respiratory status: spontaneous breathing, nonlabored ventilation, respiratory function stable and patient connected to nasal cannula oxygen Cardiovascular status: blood pressure returned to baseline and stable Postop Assessment: no apparent nausea or vomiting Anesthetic complications: no    Last Vitals:  Vitals:   11/19/19 1115 11/19/19 1200  BP: 121/79 125/73  Pulse: 75 76  Resp: (!) 24 16  Temp:  36.7 C  SpO2: 93% 96%    Last Pain:  Vitals:   11/19/19 1200  TempSrc:   PainSc: 0-No pain                 Maleeka Sabatino

## 2019-11-19 NOTE — Transfer of Care (Signed)
Immediate Anesthesia Transfer of Care Note  Patient: Kristin [name]  Procedure(s) Performed: SHOULDER ACROMIOPLASTY (Left Shoulder) SHOULDER ARTHROSCOPY WITH ROTATOR CUFF REPAIR AND  BICEPS TENODESIS (Left Shoulder) IRRIGATION AND DEBRIDEMENT SHOULDER (Left Shoulder)  Patient Location: PACU  Anesthesia Type:GA combined with regional for post-op pain  Level of Consciousness: awake, alert  and oriented  Airway & Oxygen Therapy: Patient Spontanous Breathing and Patient connected to face mask oxygen  Post-op Assessment: Report given to RN and Post -op Vital signs reviewed and stable  Post vital signs: Reviewed and stable  Last Vitals:  Vitals Value Taken Time  BP 124/76 11/19/19 1045  Temp    Pulse 78 11/19/19 1046  Resp 18 11/19/19 1046  SpO2 97 % 11/19/19 1046  Vitals shown include unvalidated device data.  Last Pain:  Vitals:   11/19/19 0720  TempSrc:   PainSc: 0-No pain      Patients Stated Pain Goal: 3 (97/02/63 [id number])  Complications: No apparent anesthesia complications

## 2019-11-19 NOTE — Progress Notes (Signed)
Assisted Dr. Oddono with left, ultrasound guided, interscalene  block. Side rails up, monitors on throughout procedure. See vital signs in flow sheet. Tolerated Procedure well. 

## 2019-11-19 NOTE — H&P (Signed)
PREOPERATIVE H&P  Chief Complaint: Left shoulder pain  HPI: Kristin [name] is a 63 y.o. female who presents for preoperative history and physical with a diagnosis of left shoulder rotator cuff tear. Symptoms are rated as moderate to severe, and have been worsening.  This is significantly impairing activities of daily living.  She has elected for surgical management.  She has had acute on chronic symptoms, and was told she had a rotator cuff tear many years ago but never had it fixed.  She recently had another fall, things have gotten worse.  Most recent fall was September 19.  Pain is located laterally.  Past Medical History:  Diagnosis Date  . Anemia    takes OTC iron  . Arthritis   . Complication of anesthesia    lip bent durning surgery and numb for weeks after  . Depression   . Family history of anesthesia complication    mom gets nausea  . GERD (gastroesophageal reflux disease)   . HOH (hard of hearing)   . Hypothyroidism   . PONV (postoperative nausea and vomiting)    due to the Dilaudid  . Sleep apnea    uses a cpap   Past Surgical History:  Procedure Laterality Date  . ABDOMINAL HYSTERECTOMY  2010   total vag hyst  . ANTERIOR AND POSTERIOR REPAIR  2010   cystcele/rectocele done with hyst  . bunionectomy in 2015    . CARPAL TUNNEL RELEASE  2007   left-DSC  . COLONOSCOPY    . RECTAL PROLAPSE REPAIR  2013  . right hand carpal tunnel release    . SHOULDER ARTHROSCOPY Right 03/31/2013   Procedure: RIGHT SHOULDER SCOPE, POSSIBLE SAD, DCR;  Surgeon: Cammie Sickle., MD;  Location: [facility];  Service: Orthopedics;  Laterality: Right;  . TONSILLECTOMY    . TOTAL KNEE ARTHROPLASTY Right 05/07/2016   Procedure: RIGHT TOTAL KNEE ARTHROPLASTY;  Surgeon: Gaynelle Arabian, MD;  Location: [facility];  Service: Orthopedics;  Laterality: Right;   Social History   Socioeconomic History  . Marital status: Married    Spouse name: Not on file  . Number of children: Not  on file  . Years of education: Not on file  . Highest education level: Not on file  Occupational History  . Not on file  Tobacco Use  . Smoking status: Never Smoker  . Smokeless tobacco: Never Used  Substance and Sexual Activity  . Alcohol use: No  . Drug use: No  . Sexual activity: Not on file  Other Topics Concern  . Not on file  Social History Narrative  . Not on file   Social Determinants of Health   Financial Resource Strain:   . Difficulty of Paying Living Expenses: Not on file  Food Insecurity:   . Worried About Charity fundraiser in the Last Year: Not on file  . Ran Out of Food in the Last Year: Not on file  Transportation Needs:   . Lack of Transportation (Medical): Not on file  . Lack of Transportation (Non-Medical): Not on file  Physical Activity:   . Days of Exercise per Week: Not on file  . Minutes of Exercise per Session: Not on file  Stress:   . Feeling of Stress : Not on file  Social Connections:   . Frequency of Communication with Friends and Family: Not on file  . Frequency of Social Gatherings with Friends and Family: Not on file  . Attends Religious Services: Not  on file  . Active Member of Clubs or Organizations: Not on file  . Attends Banker Meetings: Not on file  . Marital Status: Not on file   History reviewed. No pertinent family history. Allergies  Allergen Reactions  . Dilaudid [Hydromorphone Hcl] Itching, Nausea And Vomiting and Other (See Comments)    blisters   Prior to Admission medications   Medication Sig Start Date End Date Taking? Authorizing Provider  calcium-vitamin D 250-100 MG-UNIT tablet Take 1 tablet by mouth 2 (two) times daily.   Yes [provider]  ferrous gluconate (FERGON) 240 (27 FE) MG tablet Take 240 mg by mouth 3 (three) times daily with meals.   Yes Alver Fisher, RN  levothyroxine (SYNTHROID, LEVOTHROID) 150 MCG tablet Take 150 mcg by mouth daily before breakfast.   Yes [provider]  omeprazole (PRILOSEC) 20 MG capsule Take 20 mg by mouth 2 (two) times daily.   Yes [provider]  polyvinyl alcohol (LIQUIFILM TEARS) 1.4 % ophthalmic solution Place 1 drop into both eyes as needed for dry eyes.   Yes [provider]  valACYclovir (VALTREX) 1000 MG tablet Take 500 mg by mouth daily.   Yes [provider]  vortioxetine HBr (TRINTELLIX) 10 MG TABS tablet Take 10 mg by mouth daily.   Yes Alver Fisher, RN     Positive ROS: All other systems have been reviewed and were otherwise negative with the exception of those mentioned in the HPI and as above.  Physical Exam: General: Alert, no acute distress Cardiovascular: No pedal edema Respiratory: No cyanosis, no use of accessory musculature GI: No organomegaly, abdomen is soft and non-tender Skin: No lesions in the area of chief complaint Neurologic: Sensation intact distally Psychiatric: Patient is competent for consent with normal mood and affect Lymphatic: No axillary or cervical lymphadenopathy  MUSCULOSKELETAL: Left shoulder active motion 0 to 170 degrees with positive pain over the biceps with weakness with subscapularis testing.  Positive drop arm.  No pain over the [facility] joint.  Assessment: Left shoulder full-thickness supraspinatus tear and subscapularis tear with biceps tendon dislocation, acute on chronic   Plan: Plan for Procedure(s): SHOULDER ACROMIOPLASTY SHOULDER ARTHROSCOPY WITH ROTATOR CUFF REPAIR AND OPEN BICEPS TENODESIS IRRIGATION AND DEBRIDEMENT SHOULDER  The risks benefits and alternatives were discussed with the patient including but not limited to the risks of nonoperative treatment, versus surgical intervention including infection, bleeding, nerve injury,  blood clots, cardiopulmonary complications, morbidity, mortality, among others, and they were willing to proceed.   We have also discussed the risks for persistent pain, incomplete healing, persistent  weakness, recurrent rotator cuff tear, Popeye sign, among others.  Eulas Post, MD Cell [phone]   11/19/2019 7:20 AM

## 2019-11-19 NOTE — Discharge Instructions (Signed)
Post Anesthesia Home Care Instructions  Activity: Get plenty of rest for the remainder of the day. A responsible individual must stay with you for 24 hours following the procedure.  For the next 24 hours, DO NOT: -Drive a car -Paediatric nurse -Drink alcoholic beverages -Take any medication unless instructed by your physician -Make any legal decisions or sign important papers.  Meals: Start with liquid foods such as gelatin or soup. Progress to regular foods as tolerated. Avoid greasy, spicy, heavy foods. If nausea and/or vomiting occur, drink only clear liquids until the nausea and/or vomiting subsides. Call your physician if vomiting continues.  Special Instructions/Symptoms: Your throat may feel dry or sore from the anesthesia or the breathing tube placed in your throat during surgery. If this causes discomfort, gargle with warm salt water. The discomfort should disappear within 24 hours.  If you had a scopolamine patch placed behind your ear for the management of post- operative nausea and/or vomiting:  1. The medication in the patch is effective for 72 hours, after which it should be removed.  Wrap patch in a tissue and discard in the trash. Wash hands thoroughly with soap and water. 2. You may remove the patch earlier than 72 hours if you experience unpleasant side effects which may include dry mouth, dizziness or visual disturbances. 3. Avoid touching the patch. Wash your hands with soap and water after contact with the patch.       Regional Anesthesia Blocks  1. Numbness or the inability to move the "blocked" extremity may last from 3-48 hours after placement. The length of time depends on the medication injected and your individual response to the medication. If the numbness is not going away after 48 hours, call your surgeon.  2. The extremity that is blocked will need to be protected until the numbness is gone and the  Strength has returned. Because you cannot feel it, you  will need to take extra care to avoid injury. Because it may be weak, you may have difficulty moving it or using it. You may not know what position it is in without looking at it while the block is in effect.  3. For blocks in the legs and feet, returning to weight bearing and walking needs to be done carefully. You will need to wait until the numbness is entirely gone and the strength has returned. You should be able to move your leg and foot normally before you try and bear weight or walk. You will need someone to be with you when you first try to ensure you do not fall and possibly risk injury.  4. Bruising and tenderness at the needle site are common side effects and will resolve in a few days.  5. Persistent numbness or new problems with movement should be communicated to the surgeon or the [place] [phone] [place] [phone]).   Information for Discharge Teaching: EXPAREL (bupivacaine liposome injectable suspension)   Your surgeon or anesthesiologist gave you EXPAREL(bupivacaine) to help control your pain after surgery.   EXPAREL is a local anesthetic that provides pain relief by numbing the tissue around the surgical site.  EXPAREL is designed to release pain medication over time and can control pain for up to 72 hours.  Depending on how you respond to EXPAREL, you may require less pain medication during your recovery.  Possible side effects:  Temporary loss of sensation or ability to move in the area where bupivacaine was injected.  Nausea, vomiting, constipation  Rarely, numbness and tingling in your mouth or lips, lightheadedness, or anxiety may occur.  Call your doctor right away if you think you may be experiencing any of these sensations, or if you have other questions regarding possible side effects.  Follow all other discharge instructions given to you by your surgeon or nurse. Eat a healthy diet and drink plenty of water or  other fluids.  If you return to the hospital for any reason within 96 hours following the administration of EXPAREL, it is important for health care providers to know that you have received this anesthetic. A teal colored band has been placed on your arm with the date, time and amount of EXPAREL you have received in order to alert and inform your health care providers. Please leave this armband in place for the full 96 hours following administration, and then you may remove the band.   Diet: As you were doing prior to hospitalization   Shower:  May shower but keep the wounds dry, use an occlusive plastic wrap, NO SOAKING IN TUB.  If the bandage gets wet, change with a clean dry gauze.  If you have a splint on, leave the splint in place and keep the splint dry with a plastic bag.  Dressing:  You may change your dressing 3-5 days after surgery, unless you have a splint.  If you have a splint, then just leave the splint in place and we will change your bandages during your first follow-up appointment.    If you had hand or foot surgery, we will plan to remove your stitches in about 2 weeks in the office.  For all other surgeries, there are sticky tapes (steri-strips) on your wounds and all the stitches are absorbable.  Leave the steri-strips in place when changing your dressings, they will peel off with time, usually 2-3 weeks.  Activity:  Increase activity slowly as tolerated, but follow the weight bearing instructions below.  The rules on driving is that you can not be taking narcotics while you drive, and you must feel in control of the vehicle.    Weight Bearing:   No weight bearing on left upper extremity  To prevent constipation: you may use a stool softener such as -  Colace (over the counter) 100 mg by mouth twice a day  Drink plenty of fluids (prune juice may be helpful) and high fiber foods Miralax (over the counter) for constipation as needed.    Itching:  If you experience itching  with your medications, try taking only a single pain pill, or even half a pain pill at a time.  You may take up to 10 pain pills per day, and you can also use benadryl over the counter for itching or also to help with sleep.   Precautions:  If you experience chest pain or shortness of breath - call 911 immediately for transfer to the hospital emergency department!!  If you develop a fever greater that 101 F, purulent drainage from wound, increased redness or drainage from wound, or calf pain -- Call the office at [phone]                                                Follow- Up Appointment:  Please call for an appointment to be seen in 2 weeks [place] - [phone]

## 2019-11-24 ENCOUNTER — Encounter: Payer: Self-pay | Admitting: *Deleted

## 2020-08-22 DIAGNOSIS — N993 Prolapse of vaginal vault after hysterectomy: Secondary | ICD-10-CM | POA: Insufficient documentation

## 2021-09-06 DIAGNOSIS — M2042 Other hammer toe(s) (acquired), left foot: Secondary | ICD-10-CM | POA: Insufficient documentation

## 2021-09-14 DIAGNOSIS — S90122A Contusion of left lesser toe(s) without damage to nail, initial encounter: Secondary | ICD-10-CM | POA: Insufficient documentation

## 2021-09-14 DIAGNOSIS — B351 Tinea unguium: Secondary | ICD-10-CM | POA: Insufficient documentation

## 2022-07-18 DIAGNOSIS — F332 Major depressive disorder, recurrent severe without psychotic features: Secondary | ICD-10-CM | POA: Diagnosis not present

## 2022-07-18 DIAGNOSIS — Z6841 Body Mass Index (BMI) 40.0 and over, adult: Secondary | ICD-10-CM | POA: Diagnosis not present

## 2022-07-19 DIAGNOSIS — D2239 Melanocytic nevi of other parts of face: Secondary | ICD-10-CM | POA: Diagnosis not present

## 2022-07-19 DIAGNOSIS — D485 Neoplasm of uncertain behavior of skin: Secondary | ICD-10-CM | POA: Diagnosis not present

## 2022-07-24 DIAGNOSIS — Z79890 Hormone replacement therapy (postmenopausal): Secondary | ICD-10-CM | POA: Diagnosis not present

## 2022-07-24 DIAGNOSIS — E038 Other specified hypothyroidism: Secondary | ICD-10-CM | POA: Diagnosis not present

## 2022-08-01 DIAGNOSIS — G4733 Obstructive sleep apnea (adult) (pediatric): Secondary | ICD-10-CM | POA: Diagnosis not present

## 2022-08-23 DIAGNOSIS — D225 Melanocytic nevi of trunk: Secondary | ICD-10-CM | POA: Diagnosis not present

## 2022-08-23 DIAGNOSIS — L570 Actinic keratosis: Secondary | ICD-10-CM | POA: Diagnosis not present

## 2022-08-23 DIAGNOSIS — L821 Other seborrheic keratosis: Secondary | ICD-10-CM | POA: Diagnosis not present

## 2022-08-23 DIAGNOSIS — D2239 Melanocytic nevi of other parts of face: Secondary | ICD-10-CM | POA: Diagnosis not present

## 2022-08-23 DIAGNOSIS — L814 Other melanin hyperpigmentation: Secondary | ICD-10-CM | POA: Diagnosis not present

## 2022-09-17 DIAGNOSIS — L578 Other skin changes due to chronic exposure to nonionizing radiation: Secondary | ICD-10-CM | POA: Diagnosis not present

## 2022-09-17 DIAGNOSIS — L814 Other melanin hyperpigmentation: Secondary | ICD-10-CM | POA: Diagnosis not present

## 2022-09-17 DIAGNOSIS — L820 Inflamed seborrheic keratosis: Secondary | ICD-10-CM | POA: Diagnosis not present

## 2022-09-17 DIAGNOSIS — L918 Other hypertrophic disorders of the skin: Secondary | ICD-10-CM | POA: Diagnosis not present

## 2022-09-17 DIAGNOSIS — L570 Actinic keratosis: Secondary | ICD-10-CM | POA: Diagnosis not present

## 2022-12-19 DIAGNOSIS — Z1231 Encounter for screening mammogram for malignant neoplasm of breast: Secondary | ICD-10-CM | POA: Diagnosis not present

## 2022-12-24 DIAGNOSIS — R350 Frequency of micturition: Secondary | ICD-10-CM | POA: Diagnosis not present

## 2023-01-22 DIAGNOSIS — E079 Disorder of thyroid, unspecified: Secondary | ICD-10-CM | POA: Diagnosis not present

## 2023-01-22 DIAGNOSIS — F419 Anxiety disorder, unspecified: Secondary | ICD-10-CM | POA: Diagnosis not present

## 2023-01-22 DIAGNOSIS — Z77122 Contact with and (suspected) exposure to noise: Secondary | ICD-10-CM | POA: Diagnosis not present

## 2023-01-22 DIAGNOSIS — K219 Gastro-esophageal reflux disease without esophagitis: Secondary | ICD-10-CM | POA: Diagnosis not present

## 2023-01-22 DIAGNOSIS — Z789 Other specified health status: Secondary | ICD-10-CM | POA: Diagnosis not present

## 2023-01-22 DIAGNOSIS — H9190 Unspecified hearing loss, unspecified ear: Secondary | ICD-10-CM | POA: Diagnosis not present

## 2023-01-22 DIAGNOSIS — E669 Obesity, unspecified: Secondary | ICD-10-CM | POA: Diagnosis not present

## 2023-01-22 DIAGNOSIS — H9319 Tinnitus, unspecified ear: Secondary | ICD-10-CM | POA: Diagnosis not present

## 2023-01-22 DIAGNOSIS — G4733 Obstructive sleep apnea (adult) (pediatric): Secondary | ICD-10-CM | POA: Diagnosis not present

## 2023-01-30 DIAGNOSIS — G4733 Obstructive sleep apnea (adult) (pediatric): Secondary | ICD-10-CM | POA: Diagnosis not present

## 2023-02-22 DIAGNOSIS — K7581 Nonalcoholic steatohepatitis (NASH): Secondary | ICD-10-CM | POA: Diagnosis not present

## 2023-02-22 DIAGNOSIS — D1803 Hemangioma of intra-abdominal structures: Secondary | ICD-10-CM | POA: Diagnosis not present

## 2023-04-01 DIAGNOSIS — L821 Other seborrheic keratosis: Secondary | ICD-10-CM | POA: Diagnosis not present

## 2023-04-29 DIAGNOSIS — K7581 Nonalcoholic steatohepatitis (NASH): Secondary | ICD-10-CM | POA: Diagnosis not present

## 2023-04-29 DIAGNOSIS — D1803 Hemangioma of intra-abdominal structures: Secondary | ICD-10-CM | POA: Diagnosis not present

## 2023-04-30 DIAGNOSIS — G4733 Obstructive sleep apnea (adult) (pediatric): Secondary | ICD-10-CM | POA: Diagnosis not present

## 2023-05-13 ENCOUNTER — Encounter: Payer: Self-pay | Admitting: Cardiology

## 2023-05-13 ENCOUNTER — Ambulatory Visit: Payer: Medicare PPO | Attending: Cardiology | Admitting: Cardiology

## 2023-05-13 VITALS — BP 134/84 | HR 50 | Ht 61.5 in | Wt 234.8 lb

## 2023-05-13 DIAGNOSIS — R0609 Other forms of dyspnea: Secondary | ICD-10-CM

## 2023-05-13 DIAGNOSIS — Z136 Encounter for screening for cardiovascular disorders: Secondary | ICD-10-CM | POA: Diagnosis not present

## 2023-05-13 DIAGNOSIS — G4739 Other sleep apnea: Secondary | ICD-10-CM

## 2023-05-13 NOTE — Patient Instructions (Addendum)
Medication Instructions:  Your physician recommends that you continue on your current medications as directed. Please refer to the Current Medication list given to you today.  *If you need a refill on your cardiac medications before your next appointment, please call your pharmacy*   Lab Work: None   Testing/Procedures: Your physician has requested that you have an echocardiogram in [place]. Echocardiography is a painless test that uses sound waves to create images of your heart. It provides your doctor with information about the size and shape of your heart and how well your heart's chambers and valves are working. This procedure takes approximately one hour. There are no restrictions for this procedure. Please do NOT wear cologne, perfume, aftershave, or lotions (deodorant is allowed). Please arrive 15 minutes prior to your appointment time.    Follow-Up: At [facility], you and your health needs are our priority.  As part of our continuing mission to provide you with exceptional heart care, we have created designated Provider Care Teams.  These Care Teams include your primary Cardiologist (physician) and Advanced Practice Providers (APPs -  Physician Assistants and Nurse Practitioners) who all work together to provide you with the care you need, when you need it.  Your next appointment:   16 week(s) via MyChart  Provider:   Thomasene Ripple, DO

## 2023-05-13 NOTE — Progress Notes (Unsigned)
Cardiology Office Note:    Date:  05/14/2023   ID:  Kristin [name], DOB [birth year], MRN [id number]  PCP:  Street, [name], MD  Cardiologist:  Thomasene Ripple, DO  Electrophysiologist:  None   Referring MD: Heywood Bene, MD   "I am having shortness of breath"  History of Present Illness:    Kristin [name] is a 67 y.o. female with a hx of arthritis, sleep apnea uses a CPAP, hypothyroidism, GERD, depression, morbid obesity here today to be evaluated for shortness of breath.  She tells me today that over the last several months she has been increasingly experiencing shortness of breath.  On exertion.  She notes that at times it improves eventually.  Has not had any chest pain.  She also complained that she is experiencing pulsation in the upper right side of her neck.  No lightheadedness, no dizziness.   Past Medical History:  Diagnosis Date   Anemia    takes OTC iron   Arthritis    Complication of anesthesia    lip bent durning surgery and numb for weeks after   Depression    Family history of anesthesia complication    mom gets nausea   GERD (gastroesophageal reflux disease)    HOH (hard of hearing)    Hypothyroidism    PONV (postoperative nausea and vomiting)    due to the Dilaudid   Sleep apnea    uses a cpap    Past Surgical History:  Procedure Laterality Date   ABDOMINAL HYSTERECTOMY  2010   total vag hyst   ANTERIOR AND POSTERIOR REPAIR  2010   cystcele/rectocele done with hyst   bunionectomy in 2015     CARPAL TUNNEL RELEASE  2007   left-DSC   COLONOSCOPY     IRRIGATION AND DEBRIDEMENT SHOULDER Left 11/19/2019   Procedure: IRRIGATION AND DEBRIDEMENT SHOULDER;  Surgeon: Teryl Lucy, MD;  Location: [facility];  Service: Orthopedics;  Laterality: Left;   RECTAL PROLAPSE REPAIR  2013   right hand carpal tunnel release     SHOULDER ACROMIOPLASTY Left 11/19/2019   Procedure: SHOULDER ACROMIOPLASTY;  Surgeon: Teryl Lucy, MD;  Location:  [facility];  Service: Orthopedics;  Laterality: Left;   SHOULDER ARTHROSCOPY Right 03/31/2013   Procedure: RIGHT SHOULDER SCOPE, POSSIBLE SAD, DCR;  Surgeon: Wyn Forster., MD;  Location: [facility];  Service: Orthopedics;  Laterality: Right;   SHOULDER ARTHROSCOPY WITH ROTATOR CUFF REPAIR AND OPEN BICEPS TENODESIS Left 11/19/2019   Procedure: SHOULDER ARTHROSCOPY WITH ROTATOR CUFF REPAIR AND  BICEPS TENODESIS;  Surgeon: Teryl Lucy, MD;  Location: [facility];  Service: Orthopedics;  Laterality: Left;   TONSILLECTOMY     TOTAL KNEE ARTHROPLASTY Right 05/07/2016   Procedure: RIGHT TOTAL KNEE ARTHROPLASTY;  Surgeon: Ollen Gross, MD;  Location: [facility];  Service: Orthopedics;  Laterality: Right;    Current Medications: Current Meds  Medication Sig   calcium-vitamin D 250-100 MG-UNIT tablet Take 1 tablet by mouth 2 (two) times daily.   levothyroxine (SYNTHROID, LEVOTHROID) 150 MCG tablet Take 150 mcg by mouth daily before breakfast.   omeprazole (PRILOSEC) 20 MG capsule Take 20 mg by mouth 2 (two) times daily.   ondansetron (ZOFRAN) 4 MG tablet Take 1 tablet (4 mg total) by mouth every 8 (eight) hours as needed for nausea or vomiting.   valACYclovir (VALTREX) 1000 MG tablet Take 500 mg by mouth daily.     Allergies:   Dilaudid [  hydromorphone hcl]   Social History   Socioeconomic History   Marital status: Married    Spouse name: Not on file   Number of children: Not on file   Years of education: Not on file   Highest education level: Not on file  Occupational History   Not on file  Tobacco Use   Smoking status: Never   Smokeless tobacco: Never  Vaping Use   Vaping Use: Never used  Substance and Sexual Activity   Alcohol use: No   Drug use: No   Sexual activity: Not on file  Other Topics Concern   Not on file  Social History Narrative   Not on file   Social Determinants of Health   Financial Resource Strain: Not on file   Food Insecurity: Not on file  Transportation Needs: Not on file  Physical Activity: Not on file  Stress: Not on file  Social Connections: Not on file     Family History: The patient's family history is not on file.  ROS:   Review of Systems  Constitution: Negative for decreased appetite, fever and weight gain.  HENT: Negative for congestion, ear discharge, hoarse voice and sore throat.   Eyes: Negative for discharge, redness, vision loss in right eye and visual halos.  Cardiovascular: Negative for chest pain, dyspnea on exertion, leg swelling, orthopnea and palpitations.  Respiratory: Reports shortness of breath.  Negative for cough, hemoptysis, and snoring.   Endocrine: Negative for heat intolerance and polyphagia.  Hematologic/Lymphatic: Negative for bleeding problem. Does not bruise/bleed easily.  Skin: Negative for flushing, nail changes, rash and suspicious lesions.  Musculoskeletal: Negative for arthritis, joint pain, muscle cramps, myalgias, neck pain and stiffness.  Gastrointestinal: Negative for abdominal pain, bowel incontinence, diarrhea and excessive appetite.  Genitourinary: Negative for decreased libido, genital sores and incomplete emptying.  Neurological: Negative for brief paralysis, focal weakness, headaches and loss of balance.  Psychiatric/Behavioral: Negative for altered mental status, depression and suicidal ideas.  Allergic/Immunologic: Negative for HIV exposure and persistent infections.    EKGs/Labs/Other Studies Reviewed:    The following studies were reviewed today:   EKG:  The ekg ordered today demonstrates   Recent Labs: No results found for requested labs within last 365 days.  Recent Lipid Panel No results found for: "CHOL", "TRIG", "HDL", "CHOLHDL", "VLDL", "LDLCALC", "LDLDIRECT"  Physical Exam:    VS:  BP 134/84 (BP Location: Left Arm, Patient Position: Sitting, Cuff Size: Large)   Pulse (!) 50   Ht 5' 1.5" (1.562 m)   Wt 234 lb 12.8 oz  (106.5 kg)   SpO2 98%   BMI 43.65 kg/m     Wt Readings from Last 3 Encounters:  05/13/23 234 lb 12.8 oz (106.5 kg)  11/19/19 220 lb 7.4 oz (100 kg)  05/07/16 195 lb (88.5 kg)     GEN: Well nourished, well developed in no acute distress HEENT: Normal NECK: No JVD; No carotid bruits LYMPHATICS: No lymphadenopathy CARDIAC: S1S2 noted,RRR, no murmurs, rubs, gallops RESPIRATORY:  Clear to auscultation without rales, wheezing or rhonchi  ABDOMEN: Soft, non-tender, non-distended, +bowel sounds, no guarding. EXTREMITIES: No edema, No cyanosis, no clubbing MUSCULOSKELETAL:  No deformity  SKIN: Warm and dry NEUROLOGIC:  Alert and oriented x 3, non-focal PSYCHIATRIC:  Normal affect, good insight  ASSESSMENT:    1. Screening for heart disease   2. DOE (dyspnea on exertion)   3. Morbid obesity (HCC)   4. Other sleep apnea    PLAN:    Chest pain  or shortness of breath will start with evaluating patient with an echocardiogram to assess for any valvular abnormalities.  And also look at her right side of the heart giving her longstanding OSA.  The patient understands the need to lose weight with diet and exercise. We have discussed specific strategies for this.    The patient is in agreement with the above plan. The patient left the office in stable condition.  The patient will follow up in   Medication Adjustments/Labs and Tests Ordered: Current medicines are reviewed at length with the patient today.  Concerns regarding medicines are outlined above.  Orders Placed This Encounter  Procedures   EKG 12-Lead   ECHOCARDIOGRAM COMPLETE   No orders of the defined types were placed in this encounter.   Patient Instructions  Medication Instructions:  Your physician recommends that you continue on your current medications as directed. Please refer to the Current Medication list given to you today.  *If you need a refill on your cardiac medications before your next appointment, please  call your pharmacy*   Lab Work: None   Testing/Procedures: Your physician has requested that you have an echocardiogram in [place]. Echocardiography is a painless test that uses sound waves to create images of your heart. It provides your doctor with information about the size and shape of your heart and how well your heart's chambers and valves are working. This procedure takes approximately one hour. There are no restrictions for this procedure. Please do NOT wear cologne, perfume, aftershave, or lotions (deodorant is allowed). Please arrive 15 minutes prior to your appointment time.    Follow-Up: At [facility], you and your health needs are our priority.  As part of our continuing mission to provide you with exceptional heart care, we have created designated Provider Care Teams.  These Care Teams include your primary Cardiologist (physician) and Advanced Practice Providers (APPs -  Physician Assistants and Nurse Practitioners) who all work together to provide you with the care you need, when you need it.  Your next appointment:   16 week(s) via MyChart  Provider:   Thomasene Ripple, DO      Adopting a Healthy Lifestyle.  Know what a healthy weight is for you (roughly BMI <25) and aim to maintain this   Aim for 7+ servings of fruits and vegetables daily   65-80+ fluid ounces of water or unsweet tea for healthy kidneys   Limit to max 1 drink of alcohol per day; avoid smoking/tobacco   Limit animal fats in diet for cholesterol and heart health - choose grass fed whenever available   Avoid highly processed foods, and foods high in saturated/trans fats   Aim for low stress - take time to unwind and care for your mental health   Aim for 150 min of moderate intensity exercise weekly for heart health, and weights twice weekly for bone health   Aim for 7-9 hours of sleep daily   When it comes to diets, agreement about the perfect plan isnt easy to find, even among the  experts. Experts at the [facility] of [employer] developed an idea known as the Healthy Eating Plate. Just imagine a plate divided into logical, healthy portions.   The emphasis is on diet quality:   Load up on vegetables and fruits - one-half of your plate: Aim for color and variety, and remember that potatoes dont count.   Go for whole grains - one-quarter of your plate: Whole wheat, barley, wheat berries,  quinoa, oats, brown rice, and foods made with them. If you want pasta, go with whole wheat pasta.   Protein power - one-quarter of your plate: Fish, chicken, beans, and nuts are all healthy, versatile protein sources. Limit red meat.   The diet, however, does go beyond the plate, offering a few other suggestions.   Use healthy plant oils, such as olive, canola, soy, corn, sunflower and peanut. Check the labels, and avoid partially hydrogenated oil, which have unhealthy trans fats.   If youre thirsty, drink water. Coffee and tea are good in moderation, but skip sugary drinks and limit milk and dairy products to one or two daily servings.   The type of carbohydrate in the diet is more important than the amount. Some sources of carbohydrates, such as vegetables, fruits, whole grains, and beans-are healthier than others.   Finally, stay active  Signed, Thomasene Ripple, DO  05/14/2023 8:44 AM    [facility]

## 2023-06-10 ENCOUNTER — Ambulatory Visit: Payer: Medicare PPO | Attending: Cardiology

## 2023-06-10 DIAGNOSIS — R0609 Other forms of dyspnea: Secondary | ICD-10-CM | POA: Diagnosis not present

## 2023-06-10 LAB — ECHOCARDIOGRAM COMPLETE: S' Lateral: 3.4 cm

## 2023-07-15 DIAGNOSIS — E559 Vitamin D deficiency, unspecified: Secondary | ICD-10-CM | POA: Diagnosis not present

## 2023-07-15 DIAGNOSIS — Z0000 Encounter for general adult medical examination without abnormal findings: Secondary | ICD-10-CM | POA: Diagnosis not present

## 2023-07-15 DIAGNOSIS — M19011 Primary osteoarthritis, right shoulder: Secondary | ICD-10-CM | POA: Diagnosis not present

## 2023-07-15 DIAGNOSIS — E785 Hyperlipidemia, unspecified: Secondary | ICD-10-CM | POA: Diagnosis not present

## 2023-07-15 DIAGNOSIS — F332 Major depressive disorder, recurrent severe without psychotic features: Secondary | ICD-10-CM | POA: Diagnosis not present

## 2023-07-15 DIAGNOSIS — E038 Other specified hypothyroidism: Secondary | ICD-10-CM | POA: Diagnosis not present

## 2023-07-15 DIAGNOSIS — E230 Hypopituitarism: Secondary | ICD-10-CM | POA: Diagnosis not present

## 2023-07-15 DIAGNOSIS — R7301 Impaired fasting glucose: Secondary | ICD-10-CM | POA: Diagnosis not present

## 2023-07-15 DIAGNOSIS — Z79899 Other long term (current) drug therapy: Secondary | ICD-10-CM | POA: Diagnosis not present

## 2023-07-23 DIAGNOSIS — E038 Other specified hypothyroidism: Secondary | ICD-10-CM | POA: Diagnosis not present

## 2023-07-25 DIAGNOSIS — G4733 Obstructive sleep apnea (adult) (pediatric): Secondary | ICD-10-CM | POA: Diagnosis not present

## 2023-07-31 DIAGNOSIS — G4733 Obstructive sleep apnea (adult) (pediatric): Secondary | ICD-10-CM | POA: Diagnosis not present

## 2023-09-02 ENCOUNTER — Encounter: Payer: Self-pay | Admitting: Cardiology

## 2023-09-02 ENCOUNTER — Ambulatory Visit: Payer: Medicare PPO | Attending: Cardiology | Admitting: Cardiology

## 2023-09-02 VITALS — BP 124/86 | HR 64 | Ht 61.0 in | Wt 234.2 lb

## 2023-09-02 DIAGNOSIS — Z13220 Encounter for screening for lipoid disorders: Secondary | ICD-10-CM

## 2023-09-02 DIAGNOSIS — E781 Pure hyperglyceridemia: Secondary | ICD-10-CM | POA: Diagnosis not present

## 2023-09-02 DIAGNOSIS — I10 Essential (primary) hypertension: Secondary | ICD-10-CM | POA: Diagnosis not present

## 2023-09-02 DIAGNOSIS — R0602 Shortness of breath: Secondary | ICD-10-CM | POA: Diagnosis not present

## 2023-09-02 MED ORDER — FENOFIBRATE 134 MG PO CAPS: 134.0000 mg | ORAL_CAPSULE | Freq: Every day | ORAL | 11 refills | Status: DC

## 2023-09-02 NOTE — Patient Instructions (Addendum)
Medication Instructions:  Your physician has recommended you make the following change in your medication:  START: Fenofibrate 134 mg once daily *If you need a refill on your cardiac medications before your next appointment, please call your pharmacy*   Lab Work: Your physician recommends that you have labs drawn in 8 weeks: Lipids - please go fasting to the [place] office. No appt is needed.  If you have labs (blood work) drawn today and your tests are completely normal, you will receive your results only by: MyChart Message (if you have MyChart) OR A paper copy in the mail If you have any lab test that is abnormal or we need to change your treatment, we will call you to review the results.   Testing/Procedures: Your physician has recommended that you have a pulmonary function test. Pulmonary Function Tests are a group of tests that measure how well air moves in and out of your lungs.    Pulmonary Function Tests Pulmonary function tests (PFTs) are breathing tests that are used to: Measure how well your lungs work. Find out what is causing your lung problems. Find the best treatment for you. You may have PFTs: If you have a condition that affects your lungs, such as asthma or chronic obstructive pulmonary disease (COPD). To watch for changes in your lung function over time if you have a long-term (chronic) lung disease. If you are an IT trainer. PFTs check the effects of being exposed to chemicals over a long period of time. To check lung function: Before having surgery or other procedures. If you smoke. To check if prescribed medicines or treatments are helping your lungs. Tell a health care provider about: Any allergies you have. All medicines you are taking, including inhaler or nebulizer medicines, vitamins, herbs, eye drops, creams, and over-the-counter medicines. Any bleeding problems you have. Any surgeries you have had, especially recent surgery of the eye,  abdomen, or chest. These can make PFTs difficult or unsafe. Any medical conditions you have, including chest pain or heart problems, tuberculosis, or respiratory infections such as pneumonia, a cold, or the flu. Any fear of being in closed spaces (claustrophobia). Some of your tests may be in a closed space. What are the risks? Your health care provider will talk with you about risks. These may include: Feeling light-headed due to fast, deep breathing known as overbreathing or hyperventilation. An asthma attack from deep breathing. What happens before the test? Take over-the-counter and prescription medicines only as told by your health care provider. If you take inhaler or nebulizer medicines, ask your health care provider which medicines you should take on the day of your testing. Some inhaler medicines may interfere with PFTs if they are taken shortly before the tests. Follow instructions from your health care provider about what you may eat and drink. These may include: Avoiding eating large meals. Avoiding using caffeine before the testing. Not drinkingalcohol for up to 4 hours before the test. Do not use any products that contain nicotine or tobacco for up to 4 hours before your test. These products include cigarettes, chewing tobacco, and vaping devices, such as e-cigarettes. These can affect your test results. If you need help quitting, ask your health care provider. Wear comfortable clothing that will not get in the way of your breathing. Avoid exercise that takes a lot of effort (strenuous exercise) for at least 30 minutes before the test. What happens during the test?  You will be given: A soft noseclip to wear. This  allows all of your breaths to go through your mouth instead of your nose. A germ-free (sterile) mouthpiece. It will be attached to a spirometer machine that measures your breathing. You will be asked to do breathing exercises. The exercises will be done by breathing in  (inhaling) and breathing out (exhaling). You may have to repeat the exercises many times before the testing is complete. You will need to follow instructions exactly as told to get accurate results. Make sure to blow as hard and as fast as you can when you are told to do so. You may be given a medicine called a bronchodilator. This makes the small air passages in your lungs larger so you can breathe easier. The tests will be repeated after the medicine takes effect. You will be watched for any problems, such as feeling faint or dizzy, or having trouble breathing. The procedure may vary among health care providers and hospitals. What can I expect after the test? Your results will be compared with the expected lung function of someone with healthy lungs who is similar to you in several ways. These ways include age, sex, height, weight, and race or ethnicity. This is done to show how your lung function compares with normal lung function (percent predicted). The percent predicted helps your health care provider know if your lung function is normal or not. If you have had PFTs done before, your health care provider will compare your current results with past results. This shows if your lung function is better, worse, or the same as before. It is up to you to get the results of your procedure. Ask your health care provider, or the department that is doing the procedure, when your results will be ready. After you get your results, talk with your health care provider about treatment options, if necessary. This information is not intended to replace advice given to you by your health care provider. Make sure you discuss any questions you have with your health care provider. Document Revised: 05/28/2022 Document Reviewed: 05/28/2022 Elsevier Patient Education  2024 Elsevier Inc.    Follow-Up: At [facility], you and your health needs are our priority.  As part of our continuing mission to provide you  with exceptional heart care, we have created designated Provider Care Teams.  These Care Teams include your primary Cardiologist (physician) and Advanced Practice Providers (APPs -  Physician Assistants and Nurse Practitioners) who all work together to provide you with the care you need, when you need it.  We recommend signing up for the patient portal called "MyChart".  Sign up information is provided on this After Visit Summary.  MyChart is used to connect with patients for Virtual Visits (Telemedicine).  Patients are able to view lab/test results, encounter notes, upcoming appointments, etc.  Non-urgent messages can be sent to your provider as well.   To learn more about what you can do with MyChart, go to [email or web address].    Your next appointment:   6 month(s)  Provider:   Thomasene Ripple, DO

## 2023-09-02 NOTE — Progress Notes (Signed)
Cardiology Office Note:    Date:  09/02/2023   ID:  [name], DOB 02/18/1956, MRN [id number]  PCP:  Street, [name], MD  Cardiologist:  Thomasene Ripple, DO  Electrophysiologist:  None   Referring MD: Street, [name], *   " I am still short of breath"  History of Present Illness:    Kristin [name] is a 67 y.o. female with a hx of hypothyroidism and sleep apnea presents for a follow-up visit. The patient reports experiencing shortness of breath, which was particularly severe the day before the visit. The patient had an echocardiogram in June 2024, which showed normal heart function. However, the patient was informed that the heart muscle has stiffened due to age and a history of hypertension, resulting in a slight decrease in relaxation. The patient denies any history of heart failure.  The patient describes herself as fairly active, involved in cleaning out a family home, maintaining her own home, and walking dogs. Despite the physical activity, the patient experiences shortness of breath. The patient also reports high triglyceride levels (255) from a blood test in August, which the patient was not previously aware of. The patient regularly donates blood and has concerns about how medication might affect this  Past Medical History:  Diagnosis Date   Anemia    takes OTC iron   Arthritis    Complication of anesthesia    lip bent durning surgery and numb for weeks after   Depression    Family history of anesthesia complication    mom gets nausea   GERD (gastroesophageal reflux disease)    HOH (hard of hearing)    Hypothyroidism    PONV (postoperative nausea and vomiting)    due to the Dilaudid   Sleep apnea    uses a cpap    Past Surgical History:  Procedure Laterality Date   ABDOMINAL HYSTERECTOMY  2010   total vag hyst   ANTERIOR AND POSTERIOR REPAIR  2010   cystcele/rectocele done with hyst   bunionectomy in 2015     CARPAL TUNNEL RELEASE  2007    left-DSC   COLONOSCOPY     IRRIGATION AND DEBRIDEMENT SHOULDER Left 11/19/2019   Procedure: IRRIGATION AND DEBRIDEMENT SHOULDER;  Surgeon: Teryl Lucy, MD;  Location: [facility];  Service: Orthopedics;  Laterality: Left;   RECTAL PROLAPSE REPAIR  2013   right hand carpal tunnel release     SHOULDER ACROMIOPLASTY Left 11/19/2019   Procedure: SHOULDER ACROMIOPLASTY;  Surgeon: Teryl Lucy, MD;  Location: [facility];  Service: Orthopedics;  Laterality: Left;   SHOULDER ARTHROSCOPY Right 03/31/2013   Procedure: RIGHT SHOULDER SCOPE, POSSIBLE SAD, DCR;  Surgeon: Wyn Forster., MD;  Location: [facility];  Service: Orthopedics;  Laterality: Right;   SHOULDER ARTHROSCOPY WITH ROTATOR CUFF REPAIR AND OPEN BICEPS TENODESIS Left 11/19/2019   Procedure: SHOULDER ARTHROSCOPY WITH ROTATOR CUFF REPAIR AND  BICEPS TENODESIS;  Surgeon: Teryl Lucy, MD;  Location:  SURGERY CENTER;  Service: Orthopedics;  Laterality: Left;   TONSILLECTOMY     TOTAL KNEE ARTHROPLASTY Right 05/07/2016   Procedure: RIGHT TOTAL KNEE ARTHROPLASTY;  Surgeon: Ollen Gross, MD;  Location: [facility];  Service: Orthopedics;  Laterality: Right;    Current Medications: Current Meds  Medication Sig   baclofen (LIORESAL) 10 MG tablet Take 1 tablet (10 mg total) by mouth 3 (three) times daily. As needed for muscle spasm   calcium-vitamin D 250-100 MG-UNIT tablet Take 1 tablet  by mouth 2 (two) times daily.   fenofibrate micronized (LOFIBRA) 134 MG capsule Take 1 capsule (134 mg total) by mouth daily before breakfast.   ferrous gluconate (FERGON) 240 (27 FE) MG tablet Take 240 mg by mouth 3 (three) times daily with meals.   FLUoxetine (PROZAC) 20 MG tablet Take 20 mg by mouth daily.   levothyroxine (SYNTHROID) 112 MCG tablet Take 1 tablet by mouth daily.   omeprazole (PRILOSEC) 20 MG capsule Take 20 mg by mouth 2 (two) times daily.   valACYclovir (VALTREX) 1000 MG tablet Take  500 mg by mouth daily.     Allergies:   Dilaudid [hydromorphone hcl]   Social History   Socioeconomic History   Marital status: Married    Spouse name: Not on file   Number of children: Not on file   Years of education: Not on file   Highest education level: Not on file  Occupational History   Not on file  Tobacco Use   Smoking status: Never   Smokeless tobacco: Never  Vaping Use   Vaping status: Never Used  Substance and Sexual Activity   Alcohol use: No   Drug use: No   Sexual activity: Not on file  Other Topics Concern   Not on file  Social History Narrative   Not on file   Social Determinants of Health   Financial Resource Strain: Not on file  Food Insecurity: Not on file  Transportation Needs: Not on file  Physical Activity: Not on file  Stress: Not on file  Social Connections: Not on file     Family History: The patient's family history is not on file.  ROS:   Review of Systems  Constitution: Negative for decreased appetite, fever and weight gain.  HENT: Negative for congestion, ear discharge, hoarse voice and sore throat.   Eyes: Negative for discharge, redness, vision loss in right eye and visual halos.  Cardiovascular: Negative for chest pain, dyspnea on exertion, leg swelling, orthopnea and palpitations.  Respiratory: Negative for cough, hemoptysis, shortness of breath and snoring.   Endocrine: Negative for heat intolerance and polyphagia.  Hematologic/Lymphatic: Negative for bleeding problem. Does not bruise/bleed easily.  Skin: Negative for flushing, nail changes, rash and suspicious lesions.  Musculoskeletal: Negative for arthritis, joint pain, muscle cramps, myalgias, neck pain and stiffness.  Gastrointestinal: Negative for abdominal pain, bowel incontinence, diarrhea and excessive appetite.  Genitourinary: Negative for decreased libido, genital sores and incomplete emptying.  Neurological: Negative for brief paralysis, focal weakness, headaches  and loss of balance.  Psychiatric/Behavioral: Negative for altered mental status, depression and suicidal ideas.  Allergic/Immunologic: Negative for HIV exposure and persistent infections.    EKGs/Labs/Other Studies Reviewed:    The following studies were reviewed today:   EKG:  The ekg ordered today demonstrates   Recent Labs: No results found for requested labs within last 365 days.  Recent Lipid Panel No results found for: "CHOL", "TRIG", "HDL", "CHOLHDL", "VLDL", "LDLCALC", "LDLDIRECT"  Physical Exam:    VS:  BP 124/86 (BP Location: Left Arm, Patient Position: Sitting, Cuff Size: Large)   Pulse 64   Ht 5\' 1"  (1.549 m)   Wt 234 lb 3.2 oz (106.2 kg)   SpO2 94%   BMI 44.25 kg/m     Wt Readings from Last 3 Encounters:  09/02/23 234 lb 3.2 oz (106.2 kg)  05/13/23 234 lb 12.8 oz (106.5 kg)  11/19/19 220 lb 7.4 oz (100 kg)     GEN: Well nourished, well developed in  no acute distress HEENT: Normal NECK: No JVD; No carotid bruits LYMPHATICS: No lymphadenopathy CARDIAC: S1S2 noted,RRR, no murmurs, rubs, gallops RESPIRATORY:  Clear to auscultation without rales, wheezing or rhonchi  ABDOMEN: Soft, non-tender, non-distended, +bowel sounds, no guarding. EXTREMITIES: No edema, No cyanosis, no clubbing MUSCULOSKELETAL:  No deformity  SKIN: Warm and dry NEUROLOGIC:  Alert and oriented x 3, non-focal PSYCHIATRIC:  Normal affect, good insight  ASSESSMENT:    1. Screening for hyperlipidemia   2. SOB (shortness of breath)   3. Primary hypertension   4. Morbid obesity (HCC)   5. Hypertriglyceridemia    PLAN:    Hyperlipidemia Elevated triglycerides (255) in August 2024. Discussed the risk of cardiovascular disease with elevated triglycerides. -Start Fenofibrate 134 mg daily -Repeat lipid panel in 8 weeks.  Shortness of Breath Persistent symptoms, unclear etiology. Normal echocardiogram with mild age-related stiffening of the heart muscle. No history of pulmonary  evaluation. -Order Pulmonary Function Test. -Consider stress test if symptoms persist and PFT is normal.  General Health Maintenance -Continue current management of hypothyroidism. -Follow-up in 6 months or sooner if symptoms persist or worsen. The patient understands the need to lose weight with diet and exercise. We have discussed specific strategies for this.  The patient is in agreement with the above plan. The patient left the office in stable condition.  The patient will follow up in   Medication Adjustments/Labs and Tests Ordered: Current medicines are reviewed at length with the patient today.  Concerns regarding medicines are outlined above.  Orders Placed This Encounter  Procedures   Lipid panel   Pulmonary function test   Meds ordered this encounter  Medications   fenofibrate micronized (LOFIBRA) 134 MG capsule    Sig: Take 1 capsule (134 mg total) by mouth daily before breakfast.    Dispense:  30 capsule    Refill:  11    Patient Instructions  Medication Instructions:  Your physician has recommended you make the following change in your medication:  START: Fenofibrate 134 mg once daily *If you need a refill on your cardiac medications before your next appointment, please call your pharmacy*   Lab Work: Your physician recommends that you have labs drawn in 8 weeks: Lipids - please go fasting to the [place] office. No appt is needed.  If you have labs (blood work) drawn today and your tests are completely normal, you will receive your results only by: MyChart Message (if you have MyChart) OR A paper copy in the mail If you have any lab test that is abnormal or we need to change your treatment, we will call you to review the results.   Testing/Procedures: Your physician has recommended that you have a pulmonary function test. Pulmonary Function Tests are a group of tests that measure how well air moves in and out of your lungs.    Pulmonary Function  Tests Pulmonary function tests (PFTs) are breathing tests that are used to: Measure how well your lungs work. Find out what is causing your lung problems. Find the best treatment for you. You may have PFTs: If you have a condition that affects your lungs, such as asthma or chronic obstructive pulmonary disease (COPD). To watch for changes in your lung function over time if you have a long-term (chronic) lung disease. If you are an IT trainer. PFTs check the effects of being exposed to chemicals over a long period of time. To check lung function: Before having surgery or other procedures. If you smoke.  To check if prescribed medicines or treatments are helping your lungs. Tell a health care provider about: Any allergies you have. All medicines you are taking, including inhaler or nebulizer medicines, vitamins, herbs, eye drops, creams, and over-the-counter medicines. Any bleeding problems you have. Any surgeries you have had, especially recent surgery of the eye, abdomen, or chest. These can make PFTs difficult or unsafe. Any medical conditions you have, including chest pain or heart problems, tuberculosis, or respiratory infections such as pneumonia, a cold, or the flu. Any fear of being in closed spaces (claustrophobia). Some of your tests may be in a closed space. What are the risks? Your health care provider will talk with you about risks. These may include: Feeling light-headed due to fast, deep breathing known as overbreathing or hyperventilation. An asthma attack from deep breathing. What happens before the test? Take over-the-counter and prescription medicines only as told by your health care provider. If you take inhaler or nebulizer medicines, ask your health care provider which medicines you should take on the day of your testing. Some inhaler medicines may interfere with PFTs if they are taken shortly before the tests. Follow instructions from your health care  provider about what you may eat and drink. These may include: Avoiding eating large meals. Avoiding using caffeine before the testing. Not drinkingalcohol for up to 4 hours before the test. Do not use any products that contain nicotine or tobacco for up to 4 hours before your test. These products include cigarettes, chewing tobacco, and vaping devices, such as e-cigarettes. These can affect your test results. If you need help quitting, ask your health care provider. Wear comfortable clothing that will not get in the way of your breathing. Avoid exercise that takes a lot of effort (strenuous exercise) for at least 30 minutes before the test. What happens during the test?  You will be given: A soft noseclip to wear. This allows all of your breaths to go through your mouth instead of your nose. A germ-free (sterile) mouthpiece. It will be attached to a spirometer machine that measures your breathing. You will be asked to do breathing exercises. The exercises will be done by breathing in (inhaling) and breathing out (exhaling). You may have to repeat the exercises many times before the testing is complete. You will need to follow instructions exactly as told to get accurate results. Make sure to blow as hard and as fast as you can when you are told to do so. You may be given a medicine called a bronchodilator. This makes the small air passages in your lungs larger so you can breathe easier. The tests will be repeated after the medicine takes effect. You will be watched for any problems, such as feeling faint or dizzy, or having trouble breathing. The procedure may vary among health care providers and hospitals. What can I expect after the test? Your results will be compared with the expected lung function of someone with healthy lungs who is similar to you in several ways. These ways include age, sex, height, weight, and race or ethnicity. This is done to show how your lung function compares with  normal lung function (percent predicted). The percent predicted helps your health care provider know if your lung function is normal or not. If you have had PFTs done before, your health care provider will compare your current results with past results. This shows if your lung function is better, worse, or the same as before. It is up to you to  get the results of your procedure. Ask your health care provider, or the department that is doing the procedure, when your results will be ready. After you get your results, talk with your health care provider about treatment options, if necessary. This information is not intended to replace advice given to you by your health care provider. Make sure you discuss any questions you have with your health care provider. Document Revised: 05/28/2022 Document Reviewed: 05/28/2022 Elsevier Patient Education  2024 Elsevier Inc.    Follow-Up: At [facility], you and your health needs are our priority.  As part of our continuing mission to provide you with exceptional heart care, we have created designated Provider Care Teams.  These Care Teams include your primary Cardiologist (physician) and Advanced Practice Providers (APPs -  Physician Assistants and Nurse Practitioners) who all work together to provide you with the care you need, when you need it.  We recommend signing up for the patient portal called "MyChart".  Sign up information is provided on this After Visit Summary.  MyChart is used to connect with patients for Virtual Visits (Telemedicine).  Patients are able to view lab/test results, encounter notes, upcoming appointments, etc.  Non-urgent messages can be sent to your provider as well.   To learn more about what you can do with MyChart, go to [email or web address].    Your next appointment:   6 month(s)  Provider:   Thomasene Ripple, DO    Adopting a Healthy Lifestyle.  Know what a healthy weight is for you (roughly BMI <25) and aim to  maintain this   Aim for 7+ servings of fruits and vegetables daily   65-80+ fluid ounces of water or unsweet tea for healthy kidneys   Limit to max 1 drink of alcohol per day; avoid smoking/tobacco   Limit animal fats in diet for cholesterol and heart health - choose grass fed whenever available   Avoid highly processed foods, and foods high in saturated/trans fats   Aim for low stress - take time to unwind and care for your mental health   Aim for 150 min of moderate intensity exercise weekly for heart health, and weights twice weekly for bone health   Aim for 7-9 hours of sleep daily   When it comes to diets, agreement about the perfect plan isnt easy to find, even among the experts. Experts at the [facility] developed an idea known as the Healthy Eating Plate. Just imagine a plate divided into logical, healthy portions.   The emphasis is on diet quality:   Load up on vegetables and fruits - one-half of your plate: Aim for color and variety, and remember that potatoes dont count.   Go for whole grains - one-quarter of your plate: Whole wheat, barley, wheat berries, quinoa, oats, brown rice, and foods made with them. If you want pasta, go with whole wheat pasta.   Protein power - one-quarter of your plate: Fish, chicken, beans, and nuts are all healthy, versatile protein sources. Limit red meat.   The diet, however, does go beyond the plate, offering a few other suggestions.   Use healthy plant oils, such as olive, canola, soy, corn, sunflower and peanut. Check the labels, and avoid partially hydrogenated oil, which have unhealthy trans fats.   If youre thirsty, drink water. Coffee and tea are good in moderation, but skip sugary drinks and limit milk and dairy products to one or two daily servings.   The type of carbohydrate  in the diet is more important than the amount. Some sources of carbohydrates, such as vegetables, fruits, whole grains, and beans-are  healthier than others.   Finally, stay active  Signed, Thomasene Ripple, DO  09/02/2023 2:56 PM    [facility]

## 2023-10-22 DIAGNOSIS — Z13220 Encounter for screening for lipoid disorders: Secondary | ICD-10-CM | POA: Diagnosis not present

## 2023-10-22 DIAGNOSIS — E039 Hypothyroidism, unspecified: Secondary | ICD-10-CM | POA: Diagnosis not present

## 2023-10-23 DIAGNOSIS — G4733 Obstructive sleep apnea (adult) (pediatric): Secondary | ICD-10-CM | POA: Diagnosis not present

## 2023-10-23 LAB — LIPID PANEL
Chol/HDL Ratio: 3.6 {ratio} (ref 0.0–4.4)
Cholesterol, Total: 184 mg/dL (ref 100–199)
HDL: 51 mg/dL (ref >39)
LDL Chol Calc (NIH): 118 mg/dL — ABNORMAL HIGH (ref 0–99)
Triglycerides: 79 mg/dL (ref 0–149)
VLDL Cholesterol Cal: 15 mg/dL (ref 5–40)

## 2023-11-25 DIAGNOSIS — G4733 Obstructive sleep apnea (adult) (pediatric): Secondary | ICD-10-CM | POA: Diagnosis not present

## 2023-12-13 ENCOUNTER — Ambulatory Visit (HOSPITAL_COMMUNITY)
Admission: RE | Admit: 2023-12-13 | Discharge: 2023-12-13 | Disposition: A | Discharge status: Home / Self Care | Payer: Medicare PPO | Source: Ambulatory Visit | Attending: Cardiology | Admitting: Cardiology

## 2023-12-13 DIAGNOSIS — R0602 Shortness of breath: Secondary | ICD-10-CM | POA: Diagnosis not present

## 2023-12-13 LAB — PULMONARY FUNCTION TEST
DL/VA % pred: 87 %
DL/VA: 3.75 ml/min/mmHg/L
DLCO unc % pred: 90 %
DLCO unc: 16.15 ml/min/mmHg
FEF 25-75 Post: 2.89 L/s
FEF 25-75 Pre: 2.46 L/s
FEF2575-%Change-Post: 17 %
FEF2575-%Pred-Post: 150 %
FEF2575-%Pred-Pre: 128 %
FEV1-%Change-Post: 2 %
FEV1-%Pred-Post: 109 %
FEV1-%Pred-Pre: 107 %
FEV1-Post: 2.32 L
FEV1-Pre: 2.26 L
FEV1FVC-%Change-Post: 3 %
FEV1FVC-%Pred-Pre: 107 %
FEV6-%Change-Post: -1 %
FEV6-%Pred-Post: 101 %
FEV6-%Pred-Pre: 103 %
FEV6-Post: 2.7 L
FEV6-Pre: 2.74 L
FEV6FVC-%Pred-Post: 104 %
FEV6FVC-%Pred-Pre: 104 %
FVC-%Change-Post: -1 %
FVC-%Pred-Post: 97 %
FVC-%Pred-Pre: 98 %
FVC-Post: 2.7 L
FVC-Pre: 2.74 L
Post FEV1/FVC ratio: 86 %
Post FEV6/FVC ratio: 100 %
Pre FEV1/FVC ratio: 83 %
Pre FEV6/FVC Ratio: 100 %
RV % pred: 113 %
RV: 2.21 L
TLC % pred: 112 %
TLC: 5.16 L

## 2023-12-13 MED ORDER — ALBUTEROL SULFATE (2.5 MG/3ML) 0.083% IN NEBU
2.5000 mg | INHALATION_SOLUTION | Freq: Once | RESPIRATORY_TRACT | Status: AC
  Administered 2023-12-13: 2.5 mg via RESPIRATORY_TRACT

## 2024-01-01 DIAGNOSIS — Z1231 Encounter for screening mammogram for malignant neoplasm of breast: Secondary | ICD-10-CM | POA: Diagnosis not present

## 2024-01-17 DIAGNOSIS — G4733 Obstructive sleep apnea (adult) (pediatric): Secondary | ICD-10-CM | POA: Diagnosis not present

## 2024-02-17 DIAGNOSIS — Z01419 Encounter for gynecological examination (general) (routine) without abnormal findings: Secondary | ICD-10-CM | POA: Diagnosis not present

## 2024-03-10 DIAGNOSIS — Z6841 Body Mass Index (BMI) 40.0 and over, adult: Secondary | ICD-10-CM | POA: Diagnosis not present

## 2024-03-10 DIAGNOSIS — R109 Unspecified abdominal pain: Secondary | ICD-10-CM | POA: Diagnosis not present

## 2024-04-08 DIAGNOSIS — K760 Fatty (change of) liver, not elsewhere classified: Secondary | ICD-10-CM | POA: Diagnosis not present

## 2024-04-08 DIAGNOSIS — K219 Gastro-esophageal reflux disease without esophagitis: Secondary | ICD-10-CM | POA: Diagnosis not present

## 2024-04-08 DIAGNOSIS — R5383 Other fatigue: Secondary | ICD-10-CM | POA: Diagnosis not present

## 2024-04-08 DIAGNOSIS — R9389 Abnormal findings on diagnostic imaging of other specified body structures: Secondary | ICD-10-CM | POA: Diagnosis not present

## 2024-04-10 DIAGNOSIS — K760 Fatty (change of) liver, not elsewhere classified: Secondary | ICD-10-CM | POA: Diagnosis not present

## 2024-04-10 DIAGNOSIS — R5383 Other fatigue: Secondary | ICD-10-CM | POA: Diagnosis not present

## 2024-04-10 DIAGNOSIS — R935 Abnormal findings on diagnostic imaging of other abdominal regions, including retroperitoneum: Secondary | ICD-10-CM | POA: Diagnosis not present

## 2024-04-15 DIAGNOSIS — K7689 Other specified diseases of liver: Secondary | ICD-10-CM | POA: Diagnosis not present

## 2024-04-15 DIAGNOSIS — R935 Abnormal findings on diagnostic imaging of other abdominal regions, including retroperitoneum: Secondary | ICD-10-CM | POA: Diagnosis not present

## 2024-04-16 DIAGNOSIS — D1803 Hemangioma of intra-abdominal structures: Secondary | ICD-10-CM | POA: Diagnosis not present

## 2024-04-17 DIAGNOSIS — G4733 Obstructive sleep apnea (adult) (pediatric): Secondary | ICD-10-CM | POA: Diagnosis not present

## 2024-05-08 DIAGNOSIS — Z6841 Body Mass Index (BMI) 40.0 and over, adult: Secondary | ICD-10-CM | POA: Diagnosis not present

## 2024-05-08 DIAGNOSIS — R1031 Right lower quadrant pain: Secondary | ICD-10-CM | POA: Diagnosis not present

## 2024-05-08 DIAGNOSIS — R509 Fever, unspecified: Secondary | ICD-10-CM | POA: Diagnosis not present

## 2024-05-08 DIAGNOSIS — E663 Overweight: Secondary | ICD-10-CM | POA: Diagnosis not present

## 2024-05-19 DIAGNOSIS — D126 Benign neoplasm of colon, unspecified: Secondary | ICD-10-CM | POA: Diagnosis not present

## 2024-05-19 DIAGNOSIS — Z885 Allergy status to narcotic agent status: Secondary | ICD-10-CM | POA: Diagnosis not present

## 2024-05-19 DIAGNOSIS — K644 Residual hemorrhoidal skin tags: Secondary | ICD-10-CM | POA: Diagnosis not present

## 2024-05-19 DIAGNOSIS — K635 Polyp of colon: Secondary | ICD-10-CM | POA: Diagnosis not present

## 2024-05-19 DIAGNOSIS — D122 Benign neoplasm of ascending colon: Secondary | ICD-10-CM | POA: Diagnosis not present

## 2024-05-19 DIAGNOSIS — Z79890 Hormone replacement therapy (postmenopausal): Secondary | ICD-10-CM | POA: Diagnosis not present

## 2024-05-19 DIAGNOSIS — Z1211 Encounter for screening for malignant neoplasm of colon: Secondary | ICD-10-CM | POA: Diagnosis not present

## 2024-05-19 DIAGNOSIS — K219 Gastro-esophageal reflux disease without esophagitis: Secondary | ICD-10-CM | POA: Diagnosis not present

## 2024-05-19 DIAGNOSIS — K5730 Diverticulosis of large intestine without perforation or abscess without bleeding: Secondary | ICD-10-CM | POA: Diagnosis not present

## 2024-05-19 DIAGNOSIS — E079 Disorder of thyroid, unspecified: Secondary | ICD-10-CM | POA: Diagnosis not present

## 2024-05-19 DIAGNOSIS — G4733 Obstructive sleep apnea (adult) (pediatric): Secondary | ICD-10-CM | POA: Diagnosis not present

## 2024-05-26 ENCOUNTER — Ambulatory Visit: Attending: Cardiology | Admitting: Cardiology

## 2024-05-26 ENCOUNTER — Encounter: Payer: Self-pay | Admitting: Cardiology

## 2024-05-26 VITALS — BP 108/66 | HR 58 | Ht 61.0 in | Wt 237.3 lb

## 2024-05-26 DIAGNOSIS — Z01812 Encounter for preprocedural laboratory examination: Secondary | ICD-10-CM | POA: Diagnosis not present

## 2024-05-26 DIAGNOSIS — R0602 Shortness of breath: Secondary | ICD-10-CM

## 2024-05-26 DIAGNOSIS — I10 Essential (primary) hypertension: Secondary | ICD-10-CM | POA: Diagnosis not present

## 2024-05-26 DIAGNOSIS — E781 Pure hyperglyceridemia: Secondary | ICD-10-CM

## 2024-05-26 DIAGNOSIS — R0609 Other forms of dyspnea: Secondary | ICD-10-CM

## 2024-05-26 NOTE — Progress Notes (Signed)
 Cardiology Office Note:    Date:  05/29/2024   ID:  Kristin [name], DOB 29-Nov-1955, MRN [id number]  PCP:  Street, Lonni CHRISTELLA, MD  Cardiologist:  Dub Huntsman, DO  Electrophysiologist:  None   Referring MD: Street, Lonni CHRISTELLA, *    I am still short of breath  History of Present Illness:    Kristin [name] is a 68 y.o. female with a hx of hypothyroidism and sleep apnea presents for a follow-up visit.  Since her last visit with me she continue to experience worsening shortness of breath.   At her last visit due to the shortness of breath I send her for PFT - previous echo in 2024 was normal  She experiences persistent shortness of breath without progression. A pulmonary function test was performed, reportedly normal. She uses a CPAP machine for sleep apnea, managed by a lung specialist in [place].  She is worried that the shortness of breath is getting worse. She is really concern about this.   Past Medical History:  Diagnosis Date   Anemia    takes OTC iron   Arthritis    Complication of anesthesia    lip bent durning surgery and numb for weeks after   Depression    Family history of anesthesia complication    mom gets nausea   GERD (gastroesophageal reflux disease)    HOH (hard of hearing)    Hypothyroidism    PONV (postoperative nausea and vomiting)    due to the Dilaudid   Sleep apnea    uses a cpap    Past Surgical History:  Procedure Laterality Date   ABDOMINAL HYSTERECTOMY  2010   total vag hyst   ANTERIOR AND POSTERIOR REPAIR  2010   cystcele/rectocele done with hyst   bunionectomy in 2015     CARPAL TUNNEL RELEASE  2007   left-DSC   COLONOSCOPY     IRRIGATION AND DEBRIDEMENT SHOULDER Left 11/19/2019   Procedure: IRRIGATION AND DEBRIDEMENT SHOULDER;  Surgeon: Josefina Chew, MD;  Location: [facility];  Service: Orthopedics;  Laterality: Left;   RECTAL PROLAPSE REPAIR  2013   right hand carpal tunnel release     SHOULDER  ACROMIOPLASTY Left 11/19/2019   Procedure: SHOULDER ACROMIOPLASTY;  Surgeon: Josefina Chew, MD;  Location: [facility];  Service: Orthopedics;  Laterality: Left;   SHOULDER ARTHROSCOPY Right 03/31/2013   Procedure: RIGHT SHOULDER SCOPE, POSSIBLE SAD, DCR;  Surgeon: Lamar LULLA Leonor Mickey., MD;  Location: [facility];  Service: Orthopedics;  Laterality: Right;   SHOULDER ARTHROSCOPY WITH ROTATOR CUFF REPAIR AND OPEN BICEPS TENODESIS Left 11/19/2019   Procedure: SHOULDER ARTHROSCOPY WITH ROTATOR CUFF REPAIR AND  BICEPS TENODESIS;  Surgeon: Josefina Chew, MD;  Location: [facility];  Service: Orthopedics;  Laterality: Left;   TONSILLECTOMY     TOTAL KNEE ARTHROPLASTY Right 05/07/2016   Procedure: RIGHT TOTAL KNEE ARTHROPLASTY;  Surgeon: Dempsey Moan, MD;  Location: [facility];  Service: Orthopedics;  Laterality: Right;    Current Medications: Current Meds  Medication Sig   calcium-vitamin D 250-100 MG-UNIT tablet Take 1 tablet by mouth 2 (two) times daily.   fenofibrate  micronized (LOFIBRA) 134 MG capsule Take 1 capsule (134 mg total) by mouth daily before breakfast.   ferrous gluconate (FERGON) 240 (27 FE) MG tablet Take 240 mg by mouth 3 (three) times daily with meals.   FLUoxetine (PROZAC) 20 MG tablet Take 20 mg by mouth daily.   hydrOXYzine (ATARAX) 50 MG  tablet Take 50 mg by mouth every 6 (six) hours as needed.   levothyroxine  (SYNTHROID ) 112 MCG tablet Take 1 tablet by mouth daily.   omeprazole (PRILOSEC) 20 MG capsule Take 20 mg by mouth 2 (two) times daily.   polyvinyl alcohol (LIQUIFILM TEARS) 1.4 % ophthalmic solution Place 1 drop into both eyes as needed for dry eyes.   valACYclovir (VALTREX) 1000 MG tablet Take 500 mg by mouth daily.     Allergies:   Dilaudid [hydromorphone hcl]   Social History   Socioeconomic History   Marital status: Married    Spouse name: Not on file   Number of children: Not on file   Years of education: Not on file    Highest education level: Not on file  Occupational History   Not on file  Tobacco Use   Smoking status: Never   Smokeless tobacco: Never  Vaping Use   Vaping status: Never Used  Substance and Sexual Activity   Alcohol use: No   Drug use: No   Sexual activity: Not on file  Other Topics Concern   Not on file  Social History Narrative   Not on file   Social Drivers of Health   Financial Resource Strain: Not on file  Food Insecurity: Not on file  Transportation Needs: Not on file  Physical Activity: Not on file  Stress: Not on file  Social Connections: Not on file     Family History: The patient's family history is not on file.  ROS:   Review of Systems  Constitution: Negative for decreased appetite, fever and weight gain.  HENT: Negative for congestion, ear discharge, hoarse voice and sore throat.   Eyes: Negative for discharge, redness, vision loss in right eye and visual halos.  Cardiovascular: Negative for chest pain, dyspnea on exertion, leg swelling, orthopnea and palpitations.  Respiratory: Negative for cough, hemoptysis, shortness of breath and snoring.   Endocrine: Negative for heat intolerance and polyphagia.  Hematologic/Lymphatic: Negative for bleeding problem. Does not bruise/bleed easily.  Skin: Negative for flushing, nail changes, rash and suspicious lesions.  Musculoskeletal: Negative for arthritis, joint pain, muscle cramps, myalgias, neck pain and stiffness.  Gastrointestinal: Negative for abdominal pain, bowel incontinence, diarrhea and excessive appetite.  Genitourinary: Negative for decreased libido, genital sores and incomplete emptying.  Neurological: Negative for brief paralysis, focal weakness, headaches and loss of balance.  Psychiatric/Behavioral: Negative for altered mental status, depression and suicidal ideas.  Allergic/Immunologic: Negative for HIV exposure and persistent infections.    EKGs/Labs/Other Studies Reviewed:    The following  studies were reviewed today:   EKG:  The ekg ordered today demonstrates   Recent Labs: No results found for requested labs within last 365 days.  Recent Lipid Panel    Component Value Date/Time   CHOL 184 10/22/2023 0844   TRIG 79 10/22/2023 0844   HDL 51 10/22/2023 0844   CHOLHDL 3.6 10/22/2023 0844   LDLCALC 118 (H) 10/22/2023 0844    Physical Exam:    VS:  BP 108/66 (BP Location: Left Arm, Patient Position: Sitting, Cuff Size: Normal)   Pulse (!) 58   Ht 5' 1 (1.549 m)   Wt 237 lb 4.8 oz (107.6 kg)   SpO2 95%   BMI 44.84 kg/m     Wt Readings from Last 3 Encounters:  05/26/24 237 lb 4.8 oz (107.6 kg)  09/02/23 234 lb 3.2 oz (106.2 kg)  05/13/23 234 lb 12.8 oz (106.5 kg)     GEN: Well nourished,  well developed in no acute distress HEENT: Normal NECK: No JVD; No carotid bruits LYMPHATICS: No lymphadenopathy CARDIAC: S1S2 noted,RRR, no murmurs, rubs, gallops RESPIRATORY:  Clear to auscultation without rales, wheezing or rhonchi  ABDOMEN: Soft, non-tender, non-distended, +bowel sounds, no guarding. EXTREMITIES: No edema, No cyanosis, no clubbing MUSCULOSKELETAL:  No deformity  SKIN: Warm and dry NEUROLOGIC:  Alert and oriented x 3, non-focal PSYCHIATRIC:  Normal affect, good insight  ASSESSMENT:    1. Primary hypertension   2. DOE (dyspnea on exertion)   3. SOB (shortness of breath)   4. Pre-procedure lab exam   5. Hypertriglyceridemia    PLAN:    Hyperlipidemia  - continue fenofibrate . Needs repeat lipid profile.   Shortness of breath - worsening shortness of breath.  Differential includes cardiac etiology vs  untreated asthma, or sleep apnea vs deconditioning.   PFT is reportedly normal. I am concern that this may be her anginal equivalent. Will order a CCTA to rule coronary artery disease. - Refer to pulmonary specialist for evaluation.   Sleep apnea Using CPAP machine with potential management or titration issues. - Coordinate with pulmonary  specialist to review and adjust CPAP settings if appropriate.  Morbid obesity - lifestyle modification advised  The patient is in agreement with the above plan. The patient left the office in stable condition.  The patient will follow up in   Medication Adjustments/Labs and Tests Ordered: Current medicines are reviewed at length with the patient today.  Concerns regarding medicines are outlined above.  Orders Placed This Encounter  Procedures   CT CORONARY MORPH W/CTA COR W/SCORE W/CA W/CM &/OR WO/CM   Comp Met (CMET)   Magnesium   Ambulatory referral to Pulmonology   EKG 12-Lead   No orders of the defined types were placed in this encounter.   Patient Instructions  Medication Instructions:  Your physician recommends that you continue on your current medications as directed. Please refer to the Current Medication list given to you today.  *If you need a refill on your cardiac medications before your next appointment, please call your pharmacy*  Lab Work: CMET, Mag If you have labs (blood work) drawn today and your tests are completely normal, you will receive your results only by: MyChart Message (if you have MyChart) OR A paper copy in the mail If you have any lab test that is abnormal or we need to change your treatment, we will call you to review the results.  Testing/Procedures:   Your cardiac CT will be scheduled at one of the below locations:   [facility] [address] [place], [place] [address] [phone]  OR   [facility] [address]  [place], [place] [address]  If scheduled at [facility], please arrive at the [facility] and Children's Entrance (Entrance C2) of [facility] 30 minutes prior to test start time. You can use the FREE valet parking offered at entrance C (encouraged to control the heart rate for the test)  Proceed to the [facility] Radiology Department (first floor) to check-in and test  prep.   All radiology patients and guests should use entrance C2 at [facility], accessed from [facility], even though the hospital's physical address listed is [address].    If scheduled at the Heart and Vascular Tower at [address], please enter the parking lot using the [address] entrance and use the FREE valet service at the patient drop-off  area. Enter the buidling and check-in with registration on the main floor.   Please follow these instructions carefully (unless otherwise directed):  An IV will be required for this test and Nitroglycerin will be given.   On the Night Before the Test: Be sure to Drink plenty of water. Do not consume any caffeinated/decaffeinated beverages or chocolate 12 hours prior to your test. Do not take any antihistamines 12 hours prior to your test.  On the Day of the Test: Drink plenty of water until 1 hour prior to the test. Do not eat any food 1 hour prior to test. You may take your regular medications prior to the test.  If you take Furosemide/Hydrochlorothiazide/Spironolactone/Chlorthalidone, please HOLD on the morning of the test. Patients who wear a continuous glucose monitor MUST remove the device prior to scanning. FEMALES- please wear underwire-free bra if available, avoid dresses & tight clothing      After the Test: Drink plenty of water. After receiving IV contrast, you may experience a mild flushed feeling. This is normal. On occasion, you may experience a mild rash up to 24 hours after the test. This is not dangerous. If this occurs, you can take Benadryl  25 mg, Zyrtec, Claritin, or Allegra and increase your fluid intake. (Patients taking Tikosyn should avoid Benadryl , and may take Zyrtec, Claritin, or Allegra) If you experience trouble breathing, this can be serious. If it is severe call 911 IMMEDIATELY. If it is mild, please call our office.  We will call to schedule your test 2-4 weeks out  understanding that some insurance companies will need an authorization prior to the service being performed.   For more information and frequently asked questions, please visit our website : [email or web address]  For non-scheduling related questions, please contact the cardiac imaging nurse navigator should you have any questions/concerns: Cardiac Imaging Nurse Navigators Direct Office Dial: [phone]   For scheduling needs, including cancellations and rescheduling, please call [place], [phone].   Follow-Up: At [facility], you and your health needs are our priority.  As part of our continuing mission to provide you with exceptional heart care, our providers are all part of one team.  This team includes your primary Cardiologist (physician) and Advanced Practice Providers or APPs (Physician Assistants and Nurse Practitioners) who all work together to provide you with the care you need, when you need it.  Your next appointment:   6 month(s)  Provider:   Buddy Loeffelholz, DO       Adopting a Healthy Lifestyle.  Know what a healthy weight is for you (roughly BMI <25) and aim to maintain this   Aim for 7+ servings of fruits and vegetables daily   65-80+ fluid ounces of water or unsweet tea for healthy kidneys   Limit to max 1 drink of alcohol per day; avoid smoking/tobacco   Limit animal fats in diet for cholesterol and heart health - choose grass fed whenever available   Avoid highly processed foods, and foods high in saturated/trans fats   Aim for low stress - take time to unwind and care for your mental health   Aim for 150 min of moderate intensity exercise weekly for heart health, and weights twice weekly for bone health   Aim for 7-9 hours of sleep daily   When it comes to diets, agreement about the perfect plan isnt easy to find, even among the experts. Experts at the [facility] [employer] developed an idea known as the Healthy  Eating Plate. Just  imagine a plate divided into logical, healthy portions.   The emphasis is on diet quality:   Load up on vegetables and fruits - one-half of your plate: Aim for color and variety, and remember that potatoes dont count.   Go for whole grains - one-quarter of your plate: Whole wheat, barley, wheat berries, quinoa, oats, brown rice, and foods made with them. If you want pasta, go with whole wheat pasta.   Protein power - one-quarter of your plate: Fish, chicken, beans, and nuts are all healthy, versatile protein sources. Limit red meat.   The diet, however, does go beyond the plate, offering a few other suggestions.   Use healthy plant oils, such as olive, canola, soy, corn, sunflower and peanut. Check the labels, and avoid partially hydrogenated oil, which have unhealthy trans fats.   If youre thirsty, drink water. Coffee and tea are good in moderation, but skip sugary drinks and limit milk and dairy products to one or two daily servings.   The type of carbohydrate in the diet is more important than the amount. Some sources of carbohydrates, such as vegetables, fruits, whole grains, and beans-are healthier than others.   Finally, stay active  Signed, Dub Huntsman, DO  05/29/2024 9:13 PM    [facility]

## 2024-05-26 NOTE — Patient Instructions (Addendum)
 Medication Instructions:  Your physician recommends that you continue on your current medications as directed. Please refer to the Current Medication list given to you today.  *If you need a refill on your cardiac medications before your next appointment, please call your pharmacy*  Lab Work: CMET, Mag If you have labs (blood work) drawn today and your tests are completely normal, you will receive your results only by: MyChart Message (if you have MyChart) OR A paper copy in the mail If you have any lab test that is abnormal or we need to change your treatment, we will call you to review the results.  Testing/Procedures:   Your cardiac CT will be scheduled at one of the below locations:   [facility] [address] [place], [place] [address] [phone]  OR   [facility] [address]  [place], [place] [address]  If scheduled at [facility], please arrive at the [facility] and Children's Entrance (Entrance C2) of [facility] 30 minutes prior to test start time. You can use the FREE valet parking offered at entrance C (encouraged to control the heart rate for the test)  Proceed to the [facility] Radiology Department (first floor) to check-in and test prep.   All radiology patients and guests should use entrance C2 at [facility], accessed from [facility], even though the hospital's physical address listed is [address]    If scheduled at the Heart and Vascular Tower at [address], please enter the parking lot using the [address] entrance and use the FREE valet service at the patient drop-off area. Enter the buidling and check-in with registration on the main floor.   Please follow these instructions carefully (unless otherwise directed):  An IV will be required for this test and Nitroglycerin will be given.   On the Night Before the Test: Be sure to Drink plenty of  water. Do not consume any caffeinated/decaffeinated beverages or chocolate 12 hours prior to your test. Do not take any antihistamines 12 hours prior to your test.  On the Day of the Test: Drink plenty of water until 1 hour prior to the test. Do not eat any food 1 hour prior to test. You may take your regular medications prior to the test.  If you take Furosemide/Hydrochlorothiazide/Spironolactone/Chlorthalidone, please HOLD on the morning of the test. Patients who wear a continuous glucose monitor MUST remove the device prior to scanning. FEMALES- please wear underwire-free bra if available, avoid dresses & tight clothing      After the Test: Drink plenty of water. After receiving IV contrast, you may experience a mild flushed feeling. This is normal. On occasion, you may experience a mild rash up to 24 hours after the test. This is not dangerous. If this occurs, you can take Benadryl  25 mg, Zyrtec, Claritin, or Allegra and increase your fluid intake. (Patients taking Tikosyn should avoid Benadryl , and may take Zyrtec, Claritin, or Allegra) If you experience trouble breathing, this can be serious. If it is severe call 911 IMMEDIATELY. If it is mild, please call our office.  We will call to schedule your test 2-4 weeks out understanding that some insurance companies will need an authorization prior to the service being performed.   For more information and frequently asked questions, please visit our website : [email or web address]  For non-scheduling related questions, please contact the cardiac imaging nurse navigator should you have any questions/concerns: Cardiac Imaging Nurse Navigators  Direct Office Dial: [phone]   For scheduling needs, including cancellations and rescheduling, please call [place], [phone].   Follow-Up: At [facility] you and your health needs are our priority.  As part of our continuing mission to provide you with exceptional  heart care, our providers are all part of one team.  This team includes your primary Cardiologist (physician) and Advanced Practice Providers or APPs (Physician Assistants and Nurse Practitioners) who all work together to provide you with the care you need, when you need it.  Your next appointment:   6 month(s)  Provider:   Kardie Tobb, DO

## 2024-06-01 DIAGNOSIS — R0602 Shortness of breath: Secondary | ICD-10-CM | POA: Diagnosis not present

## 2024-06-01 DIAGNOSIS — R0609 Other forms of dyspnea: Secondary | ICD-10-CM | POA: Diagnosis not present

## 2024-06-01 DIAGNOSIS — Z01812 Encounter for preprocedural laboratory examination: Secondary | ICD-10-CM | POA: Diagnosis not present

## 2024-06-02 LAB — COMPREHENSIVE METABOLIC PANEL WITH GFR
ALT: 56 IU/L — ABNORMAL HIGH (ref 0–32)
AST: 56 IU/L — ABNORMAL HIGH (ref 0–40)
Albumin: 4.1 g/dL (ref 3.9–4.9)
Alkaline Phosphatase: 63 IU/L (ref 44–121)
BUN/Creatinine Ratio: 14 (ref 12–28)
BUN: 14 mg/dL (ref 8–27)
Bilirubin Total: 0.8 mg/dL (ref 0.0–1.2)
CO2: 16 mmol/L — ABNORMAL LOW (ref 20–29)
Calcium: 8.9 mg/dL (ref 8.7–10.3)
Chloride: 103 mmol/L (ref 96–106)
Creatinine, Ser: 1.01 mg/dL — ABNORMAL HIGH (ref 0.57–1.00)
Globulin, Total: 1.9 g/dL (ref 1.5–4.5)
Glucose: 185 mg/dL — ABNORMAL HIGH (ref 70–99)
Potassium: 4.3 mmol/L (ref 3.5–5.2)
Sodium: 138 mmol/L (ref 134–144)
Total Protein: 6 g/dL (ref 6.0–8.5)
eGFR: 61 mL/min/1.73 (ref >59)

## 2024-06-02 LAB — MAGNESIUM: Magnesium: 1.9 mg/dL (ref 1.6–2.3)

## 2024-06-05 ENCOUNTER — Encounter (HOSPITAL_BASED_OUTPATIENT_CLINIC_OR_DEPARTMENT_OTHER): Payer: Self-pay | Admitting: Radiology

## 2024-06-06 ENCOUNTER — Ambulatory Visit (HOSPITAL_BASED_OUTPATIENT_CLINIC_OR_DEPARTMENT_OTHER)
Admission: EM | Admit: 2024-06-06 | Discharge: 2024-06-06 | Disposition: A | Discharge status: Home / Self Care | Attending: Family Medicine | Admitting: Family Medicine

## 2024-06-06 ENCOUNTER — Encounter (HOSPITAL_BASED_OUTPATIENT_CLINIC_OR_DEPARTMENT_OTHER): Payer: Self-pay

## 2024-06-06 DIAGNOSIS — R300 Dysuria: Secondary | ICD-10-CM | POA: Diagnosis not present

## 2024-06-06 LAB — POCT URINALYSIS DIP (MANUAL ENTRY)
Bilirubin, UA: NEGATIVE
Glucose, UA: NEGATIVE mg/dL
Ketones, POC UA: NEGATIVE mg/dL
Nitrite, UA: NEGATIVE
Protein Ur, POC: NEGATIVE mg/dL
Spec Grav, UA: 1.01 (ref 1.010–1.025)
Urobilinogen, UA: 0.2 U/dL
pH, UA: 6 (ref 5.0–8.0)

## 2024-06-06 MED ORDER — NITROFURANTOIN MONOHYD MACRO 100 MG PO CAPS: 100.0000 mg | ORAL_CAPSULE | Freq: Two times a day (BID) | ORAL | 0 refills | Status: AC

## 2024-06-06 NOTE — ED Provider Notes (Signed)
 [facility]    CSN: [id number] Arrival date & time: 06/06/24  1013      History   Chief Complaint Chief Complaint  Patient presents with   Dysuria    HPI Kristin [name] is a 68 y.o. female.   Patient is a 68 year old female who presents today onset 2 days ago of abd fullness, burning with urination, frequency with little output.  Denies any flank pain, fever, chills, nausea or vomiting.   Dysuria   Past Medical History:  Diagnosis Date   Anemia    takes OTC iron   Arthritis    Complication of anesthesia    lip bent durning surgery and numb for weeks after   Depression    Family history of anesthesia complication    mom gets nausea   GERD (gastroesophageal reflux disease)    HOH (hard of hearing)    Hypothyroidism    PONV (postoperative nausea and vomiting)    due to the Dilaudid   Sleep apnea    uses a cpap    Patient Active Problem List   Diagnosis Date Noted   Rotator cuff tear, left 11/19/2019   OA (osteoarthritis) of knee 05/07/2016    Past Surgical History:  Procedure Laterality Date   ABDOMINAL HYSTERECTOMY  2010   total vag hyst   ANTERIOR AND POSTERIOR REPAIR  2010   cystcele/rectocele done with hyst   bunionectomy in 2015     CARPAL TUNNEL RELEASE  2007   left-DSC   COLONOSCOPY     IRRIGATION AND DEBRIDEMENT SHOULDER Left 11/19/2019   Procedure: IRRIGATION AND DEBRIDEMENT SHOULDER;  Surgeon: Josefina Chew, MD;  Location: [facility];  Service: Orthopedics;  Laterality: Left;   RECTAL PROLAPSE REPAIR  2013   right hand carpal tunnel release     SHOULDER ACROMIOPLASTY Left 11/19/2019   Procedure: SHOULDER ACROMIOPLASTY;  Surgeon: Josefina Chew, MD;  Location: [facility];  Service: Orthopedics;  Laterality: Left;   SHOULDER ARTHROSCOPY Right 03/31/2013   Procedure: RIGHT SHOULDER SCOPE, POSSIBLE SAD, DCR;  Surgeon: Lamar LULLA Leonor Mickey., MD;  Location: [facility];  Service: Orthopedics;   Laterality: Right;   SHOULDER ARTHROSCOPY WITH ROTATOR CUFF REPAIR AND OPEN BICEPS TENODESIS Left 11/19/2019   Procedure: SHOULDER ARTHROSCOPY WITH ROTATOR CUFF REPAIR AND  BICEPS TENODESIS;  Surgeon: Josefina Chew, MD;  Location:  SUR[facility]ERY [facility];  Service: Orthopedics;  Laterality: Left;   TONSILLECTOMY     TOTAL KNEE ARTHROPLASTY Right 05/07/2016   Procedure: RIGHT TOTAL KNEE ARTHROPLASTY;  Surgeon: Dempsey Moan, MD;  Location: [facility];  Service: Orthopedics;  Laterality: Right;    OB History   No obstetric history on file.      Home Medications    Prior to Admission medications   Medication Sig Start Date End Date Taking? Authorizing Provider  nitrofurantoin , macrocrystal-monohydrate, (MACROBID ) 100 MG capsule Take 1 capsule (100 mg total) by mouth 2 (two) times daily. 06/06/24  Yes Jeanna Giuffre A, FNP  calcium-vitamin D 250-100 MG-UNIT tablet Take 1 tablet by mouth 2 (two) times daily.    [provider]  fenofibrate  micronized (LOFIBRA) 134 MG capsule Take 1 capsule (134 mg total) by mouth daily before breakfast. 09/02/23 08/27/24  Tobb, Kardie, DO  ferrous gluconate (FERGON) 240 (27 FE) MG tablet Take 240 mg by mouth 3 (three) times daily with meals.    Lindner, Jodi N, RN  FLUoxetine (PROZAC) 20 MG tablet Take 20 mg by mouth daily.  [provider]  hydrOXYzine (ATARAX) 50 MG tablet Take 50 mg by mouth every 6 (six) hours as needed. 05/02/24   [provider]  levothyroxine  (SYNTHROID ) 112 MCG tablet Take 1 tablet by mouth daily. 07/23/23 07/17/24  [provider]  omeprazole (PRILOSEC) 20 MG capsule Take 20 mg by mouth 2 (two) times daily.    [provider]  polyvinyl alcohol (LIQUIFILM TEARS) 1.4 % ophthalmic solution Place 1 drop into both eyes as needed for dry eyes.    [provider]  valACYclovir (VALTREX) 1000 MG tablet Take 500 mg by mouth daily.    [provider]    Family History History reviewed.  No pertinent family history.  Social History Social History   Tobacco Use   Smoking status: Never   Smokeless tobacco: Never  Vaping Use   Vaping status: Never Used  Substance Use Topics   Alcohol use: No   Drug use: No     Allergies   Dilaudid [hydromorphone hcl]   Review of Systems Review of Systems  Genitourinary:  Positive for dysuria.   See HPI  Physical Exam Triage Vital Signs ED Triage Vitals  Encounter Vitals Group     BP 06/06/24 1047 127/81     Girls Systolic BP Percentile --      Girls Diastolic BP Percentile --      Boys Systolic BP Percentile --      Boys Diastolic BP Percentile --      Pulse Rate 06/06/24 1047 (!) 51     Resp 06/06/24 1047 20     Temp 06/06/24 1047 98.3 F (36.8 C)     Temp Source 06/06/24 1047 Oral     SpO2 06/06/24 1047 98 %     Weight --      Height --      Head Circumference --      Peak Flow --      Pain Score 06/06/24 1049 7     Pain Loc --      Pain Education --      Exclude from Growth Chart --    No data found.  Updated Vital Signs BP 127/81 (BP Location: Right Arm)   Pulse (!) 51   Temp 98.3 F (36.8 C) (Oral)   Resp 20   SpO2 98%   Visual Acuity Right Eye Distance:   Left Eye Distance:   Bilateral Distance:    Right Eye Near:   Left Eye Near:    Bilateral Near:     Physical Exam Vitals and nursing note reviewed.  Constitutional:      General: She is not in acute distress.    Appearance: Normal appearance. She is not ill-appearing, toxic-appearing or diaphoretic.  Pulmonary:     Effort: Pulmonary effort is normal.  Neurological:     Mental Status: She is alert.  Psychiatric:        Mood and Affect: Mood normal.      UC Treatments / Results  Labs (all labs ordered are listed, but only abnormal results are displayed) Labs Reviewed  POCT URINALYSIS DIP (MANUAL ENTRY) - Abnormal; Notable for the following components:      Result Value   Color, UA light yellow (*)    Clarity, UA cloudy (*)     Blood, UA trace-lysed (*)    Leukocytes, UA Moderate (2+) (*)    All other components within normal limits  URINE CULTURE    EKG   Radiology No  results found.  Procedures Procedures (including critical care time)  Medications Ordered in UC Medications - No data to display  Initial Impression / Assessment and Plan / UC Course  I have reviewed the triage vital signs and the nursing notes.  Pertinent labs & imaging results that were available during my care of the patient were reviewed by me and considered in my medical decision making (see chart for details).     Dysuria-urine with moderate leuks, trace blood and cloudy.  Will go ahead and treat for urinary tract infection at this time based on symptoms and urine sample.  Culture pending and will call with any changes if needed. Follow-up as needed Final Clinical Impressions(s) / UC Diagnoses   Final diagnoses:  Dysuria     Discharge Instructions      Treating you for urinary tract infection.  Will send for culture.  Will call with any changes need needed.  Follow-up as needed   ED Prescriptions     Medication Sig Dispense Auth. Provider   nitrofurantoin , macrocrystal-monohydrate, (MACROBID ) 100 MG capsule Take 1 capsule (100 mg total) by mouth 2 (two) times daily. 10 capsule Adah Wilbert LABOR, FNP      PDMP not reviewed this encounter.   Adah Wilbert LABOR, FNP 06/06/24 1108

## 2024-06-06 NOTE — Discharge Instructions (Signed)
 Treating you for urinary tract infection.  Will send for culture.  Will call with any changes need needed.  Follow-up as needed

## 2024-06-06 NOTE — ED Triage Notes (Signed)
 Onset 2 days ago of abd fullness, burning with urination, frequency with little output.

## 2024-06-08 ENCOUNTER — Ambulatory Visit (HOSPITAL_COMMUNITY): Payer: Self-pay

## 2024-06-08 LAB — URINE CULTURE: Culture: 40000 — AB

## 2024-06-09 ENCOUNTER — Encounter (HOSPITAL_COMMUNITY): Payer: Self-pay

## 2024-06-10 ENCOUNTER — Telehealth (HOSPITAL_COMMUNITY): Payer: Self-pay | Admitting: Emergency Medicine

## 2024-06-10 NOTE — Telephone Encounter (Signed)
 Received call from patient regarding upcoming cardiac imaging study; pt verbalizes understanding of appt date/time, parking situation and where to check in, pre-test NPO status and medications ordered, and verified current allergies; name and call back number provided for further questions should they arise Johney Frame RN Navigator Cardiac Imaging [facility] [phone] office [phone] cell

## 2024-06-10 NOTE — Telephone Encounter (Signed)
 Attempted to call patient regarding upcoming cardiac CT appointment. Left message on voicemail with name and callback number Rockwell Alexandria RN Navigator Cardiac Imaging [facility] [phone] Office [phone] Cell

## 2024-06-11 ENCOUNTER — Ambulatory Visit (INDEPENDENT_AMBULATORY_CARE_PROVIDER_SITE_OTHER)
Admission: RE | Admit: 2024-06-11 | Discharge: 2024-06-11 | Disposition: A | Discharge status: Home / Self Care | Source: Ambulatory Visit | Attending: Cardiology | Admitting: Cardiology

## 2024-06-11 DIAGNOSIS — R0609 Other forms of dyspnea: Secondary | ICD-10-CM | POA: Diagnosis not present

## 2024-06-11 DIAGNOSIS — R072 Precordial pain: Secondary | ICD-10-CM | POA: Diagnosis not present

## 2024-06-11 MED ORDER — METOPROLOL TARTRATE 5 MG/5ML IV SOLN: 10.0000 mg | Freq: Once | INTRAVENOUS | Status: DC | PRN

## 2024-06-11 MED ORDER — DILTIAZEM HCL 25 MG/5ML IV SOLN: 10.0000 mg | INTRAVENOUS | Status: DC | PRN

## 2024-06-11 MED ORDER — IOHEXOL 350 MG/ML SOLN
100.0000 mL | Freq: Once | INTRAVENOUS | Status: AC | PRN
  Administered 2024-06-11: 100 mL via INTRAVENOUS

## 2024-06-11 MED ORDER — NITROGLYCERIN 0.4 MG SL SUBL
0.8000 mg | SUBLINGUAL_TABLET | Freq: Once | SUBLINGUAL | Status: AC
  Administered 2024-06-11: 0.8 mg via SUBLINGUAL

## 2024-06-24 ENCOUNTER — Ambulatory Visit: Payer: Self-pay | Admitting: Cardiology

## 2024-06-24 MED ORDER — ROSUVASTATIN CALCIUM 10 MG PO TABS: 10.0000 mg | ORAL_TABLET | Freq: Every day | ORAL | 3 refills | Status: AC

## 2024-06-24 MED ORDER — ASPIRIN 81 MG PO TBEC: 81.0000 mg | DELAYED_RELEASE_TABLET | Freq: Every day | ORAL | Status: AC

## 2024-06-24 NOTE — Progress Notes (Signed)
 Recall placed for follow up appointment.

## 2024-06-24 NOTE — Telephone Encounter (Signed)
 Crestor  10 mg once daily, Aspirin  81 mg once daily sent to preferred pharmacy.

## 2024-06-24 NOTE — Addendum Note (Signed)
 Addended by: MELIDA ROLIN HERO on: 06/24/2024 05:34 PM   Modules accepted: Orders

## 2024-06-25 ENCOUNTER — Telehealth: Payer: Self-pay | Admitting: Cardiology

## 2024-06-25 NOTE — Telephone Encounter (Signed)
 Pt c/o medication issue:  1. Name of Medication: fenofibrate  micronized (LOFIBRA) 134 MG capsule    rosuvastatin  (CRESTOR ) 10 MG tablet    2. How are you currently taking this medication (dosage and times per day)?   3. Are you having a reaction (difficulty breathing--STAT)? No  4. What is your medication issue? Pt would like a c/b regarding whether she is to take both above medications at the same time. As well as if it's okay to take Crestor  being that she has Fatty Liver Disease. Please advise

## 2024-06-29 NOTE — Telephone Encounter (Signed)
 Called N/A LVM

## 2024-07-07 NOTE — Telephone Encounter (Signed)
 Called pt, relayed message. Pt wants to know can she have the orders placed for the lab work to be drawn, in [place].

## 2024-07-18 DIAGNOSIS — G4733 Obstructive sleep apnea (adult) (pediatric): Secondary | ICD-10-CM | POA: Diagnosis not present

## 2024-07-29 DIAGNOSIS — Z0000 Encounter for general adult medical examination without abnormal findings: Secondary | ICD-10-CM | POA: Diagnosis not present

## 2024-07-29 DIAGNOSIS — K2960 Other gastritis without bleeding: Secondary | ICD-10-CM | POA: Diagnosis not present

## 2024-07-29 DIAGNOSIS — Z1339 Encounter for screening examination for other mental health and behavioral disorders: Secondary | ICD-10-CM | POA: Diagnosis not present

## 2024-07-29 DIAGNOSIS — E230 Hypopituitarism: Secondary | ICD-10-CM | POA: Diagnosis not present

## 2024-07-29 DIAGNOSIS — E785 Hyperlipidemia, unspecified: Secondary | ICD-10-CM | POA: Diagnosis not present

## 2024-07-29 DIAGNOSIS — Z23 Encounter for immunization: Secondary | ICD-10-CM | POA: Diagnosis not present

## 2024-07-29 DIAGNOSIS — G4733 Obstructive sleep apnea (adult) (pediatric): Secondary | ICD-10-CM | POA: Diagnosis not present

## 2024-07-29 DIAGNOSIS — Z79899 Other long term (current) drug therapy: Secondary | ICD-10-CM | POA: Diagnosis not present

## 2024-07-29 DIAGNOSIS — E559 Vitamin D deficiency, unspecified: Secondary | ICD-10-CM | POA: Diagnosis not present

## 2024-07-29 DIAGNOSIS — E038 Other specified hypothyroidism: Secondary | ICD-10-CM | POA: Diagnosis not present

## 2024-08-24 ENCOUNTER — Other Ambulatory Visit: Payer: Self-pay | Admitting: Cardiology

## 2024-08-27 ENCOUNTER — Telehealth: Payer: Self-pay | Admitting: Cardiology

## 2024-08-27 MED ORDER — FENOFIBRATE 134 MG PO CAPS: 134.0000 mg | ORAL_CAPSULE | Freq: Every day | ORAL | 3 refills | Status: AC

## 2024-08-27 NOTE — Telephone Encounter (Signed)
 RX sent in

## 2024-08-27 NOTE — Telephone Encounter (Signed)
*  STAT* If patient is at the pharmacy, call can be transferred to refill team.   1. Which medications need to be refilled? (please list name of each medication and dose if known)   fenofibrate  micronized (LOFIBRA) 134 MG capsule     2. Would you like to learn more about the convenience, safety, & potential cost savings by using the [facility]? No    3. Are you open to using the [facility] (Type [facility]. No    4. Which pharmacy/location (including street and city if local pharmacy) is medication to be sent to? [facility] - [place], [place] - [address]     5. Do they need a 30 day or 90 day supply? 90 day   Pt is out of medication.

## 2024-09-07 ENCOUNTER — Other Ambulatory Visit: Payer: Self-pay

## 2024-09-07 ENCOUNTER — Encounter: Payer: Self-pay | Admitting: *Deleted

## 2024-09-07 DIAGNOSIS — Z79899 Other long term (current) drug therapy: Secondary | ICD-10-CM

## 2024-09-07 DIAGNOSIS — Z23 Encounter for immunization: Secondary | ICD-10-CM | POA: Diagnosis not present

## 2024-09-07 DIAGNOSIS — Z6841 Body Mass Index (BMI) 40.0 and over, adult: Secondary | ICD-10-CM | POA: Diagnosis not present

## 2024-09-07 NOTE — Progress Notes (Signed)
 Received a message from [facility] office. Pt in office to have labs drawn.  Spoke with Dr. Tobie, orders for Lipids and LFTs placed.  Message sent to [facility] office to make them aware.

## 2024-09-08 ENCOUNTER — Ambulatory Visit: Payer: Self-pay | Admitting: Pharmacist

## 2024-09-08 LAB — LIPID PANEL
Chol/HDL Ratio: 3 ratio (ref 0.0–4.4)
Cholesterol, Total: 121 mg/dL (ref 100–199)
HDL: 41 mg/dL (ref >39)
LDL Chol Calc (NIH): 55 mg/dL (ref 0–99)
Triglycerides: 142 mg/dL (ref 0–149)
VLDL Cholesterol Cal: 25 mg/dL (ref 5–40)

## 2024-09-08 LAB — HEPATIC FUNCTION PANEL
ALT: 37 IU/L — ABNORMAL HIGH (ref 0–32)
AST: 34 IU/L (ref 0–40)
Albumin: 4.2 g/dL (ref 3.9–4.9)
Alkaline Phosphatase: 58 IU/L (ref 49–135)
Bilirubin Total: 0.5 mg/dL (ref 0.0–1.2)
Bilirubin, Direct: 0.21 mg/dL (ref 0.00–0.40)
Total Protein: 6.2 g/dL (ref 6.0–8.5)

## 2024-09-11 ENCOUNTER — Encounter (HOSPITAL_BASED_OUTPATIENT_CLINIC_OR_DEPARTMENT_OTHER): Payer: Self-pay | Admitting: Pulmonary Disease

## 2024-09-11 ENCOUNTER — Ambulatory Visit (HOSPITAL_BASED_OUTPATIENT_CLINIC_OR_DEPARTMENT_OTHER): Admitting: Pulmonary Disease

## 2024-09-11 VITALS — BP 149/80 | HR 55 | Ht 61.0 in | Wt 238.0 lb

## 2024-09-11 DIAGNOSIS — R0609 Other forms of dyspnea: Secondary | ICD-10-CM

## 2024-09-11 MED ORDER — ALBUTEROL SULFATE HFA 108 (90 BASE) MCG/ACT IN AERS: 2.0000 | INHALATION_SPRAY | Freq: Four times a day (QID) | RESPIRATORY_TRACT | 1 refills | Status: DC | PRN

## 2024-09-11 NOTE — Patient Instructions (Signed)
 X Ambulatory sat  X ALbuterol  MDI 2 puffs as needed for wheezing or shortness of breath

## 2024-09-11 NOTE — Progress Notes (Signed)
 New Patient Pulmonology Office Visit   Subjective:  Patient ID: Kristin [name], female    DOB: 1956-03-25  MRN: [id number]  Referred by: Tobb, Kardie, DO  CC:  Chief Complaint  Patient presents with   Establish Care    Discussed the use of AI scribe software for clinical note transcription with the patient, who gave verbal consent to proceed.  History of Present Illness Kristin [name] is a 68 year old female who presents with shortness of breath. She was referred by her heart doctor for evaluation of her lungs.  She has experienced shortness of breath for approximately ten years, initially during stair climbing. Pulmonary function tests are normal, and a CT scan shows coronary artery calcification. No seasonal variation in symptoms. Minor leg swelling has occurred in the past without significant fluid retention.  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 She is currently cleaning a dusty attic, which may exacerbate her symptoms. There is concern about COPD, but pulmonary function tests are clear. Asthma is considered, but she has never been formally diagnosed, and inhaler use did not provide relief.  She experiences labored breathing in hot weather and uses a CPAP machine nightly, managed by Dr. Lindle in [place]. Her medication regimen includes cholesterol medications and aspirin , with no significant weight change observed despite being prescribed Ozempic for weight management.     Significant tests/ events reviewed  11/2023 PFTs nml 05/2024 CT cors >> Clear lungs 05/2023 echo gr1 DD  ROS  Constitutional: negative for anorexia, fevers and sweats  Eyes: negative for irritation, redness and visual disturbance  Ears, nose, mouth, throat, and face: negative for earaches, epistaxis, nasal congestion and sore throat  Respiratory: negative for cough,sputum and wheezing  Cardiovascular: negative for chest  pain,  lower extremity edema, orthopnea, palpitations and syncope  Gastrointestinal: negative for abdominal pain, constipation, diarrhea, melena, nausea and vomiting  Genitourinary:negative for dysuria, frequency and hematuria  Hematologic/lymphatic: negative for bleeding, easy bruising and lymphadenopathy  Musculoskeletal:negative for arthralgias, muscle weakness and stiff joints  Neurological: negative for coordination problems, gait problems, headaches and weakness  Endocrine: negative for diabetic symptoms including polydipsia, polyuria and weight loss   Allergies: Cosyntropin and Dilaudid [hydromorphone hcl]  Current Outpatient Medications:    aspirin  EC 81 MG tablet, Take 1 tablet (81 mg total) by mouth daily. Swallow whole., Disp: , Rfl:    calcium -vitamin D 250-100 MG-UNIT tablet, Take 1 tablet by mouth 2 (two) times daily., Disp: , Rfl:    fenofibrate  micronized (LOFIBRA) 134 MG capsule, Take 1 capsule (134 mg total) by mouth daily before breakfast., Disp: 90 capsule, Rfl: 3   ferrous gluconate (FERGON) 240 (27 FE) MG tablet, Take 240 mg by mouth 3 (three) times daily with meals., Disp: , Rfl:    FLUoxetine (PROZAC) 20 MG tablet, Take 20 mg by mouth daily., Disp: , Rfl:    hydrOXYzine (ATARAX) 50 MG tablet, Take 50 mg by mouth every 6 (six) hours as needed., Disp: , Rfl:    levothyroxine  (SYNTHROID ) 112 MCG tablet, Take 1 tablet by mouth daily., Disp: , Rfl:    nitrofurantoin , macrocrystal-monohydrate, (MACROBID ) 100 MG capsule, Take 1 capsule (100 mg total) by mouth 2 (two) times daily., Disp: 10 capsule, Rfl: 0   omeprazole (PRILOSEC) 20 MG capsule, Take 20 mg by mouth 2 (two) times daily., Disp: , Rfl:    polyvinyl alcohol (LIQUIFILM TEARS) 1.4 % ophthalmic solution, Place 1 drop  into both eyes as needed for dry eyes., Disp: , Rfl:    rosuvastatin  (CRESTOR ) 10 MG tablet, Take 1 tablet (10 mg total) by mouth daily., Disp: 90 tablet, Rfl: 3   valACYclovir (VALTREX) 1000 MG tablet,  Take 500 mg by mouth daily., Disp: , Rfl:  Past Medical History:  Diagnosis Date   Anemia    takes OTC iron   Arthritis    Complication of anesthesia    lip bent durning surgery and numb for weeks after   Depression    Family history of anesthesia complication    mom gets nausea   GERD (gastroesophageal reflux disease)    HOH (hard of hearing)    Hypothyroidism    PONV (postoperative nausea and vomiting)    due to the Dilaudid   Sleep apnea    uses a cpap   Past Surgical History:  Procedure Laterality Date   ABDOMINAL HYSTERECTOMY  2010   total vag hyst   ANTERIOR AND POSTERIOR REPAIR  2010   cystcele/rectocele done with hyst   bunionectomy in 2015     CARPAL TUNNEL RELEASE  2007   left-DSC   COLONOSCOPY     IRRIGATION AND DEBRIDEMENT SHOULDER Left 11/19/2019   Procedure: IRRIGATION AND DEBRIDEMENT SHOULDER;  Surgeon: Josefina Chew, MD;  Location: [facility];  Service: Orthopedics;  Laterality: Left;   RECTAL PROLAPSE REPAIR  2013   right hand carpal tunnel release     SHOULDER ACROMIOPLASTY Left 11/19/2019   Procedure: SHOULDER ACROMIOPLASTY;  Surgeon: Josefina Chew, MD;  Location: [facility];  Service: Orthopedics;  Laterality: Left;   SHOULDER ARTHROSCOPY Right 03/31/2013   Procedure: RIGHT SHOULDER SCOPE, POSSIBLE SAD, DCR;  Surgeon: Lamar LULLA Leonor Mickey., MD;  Location: [facility];  Service: Orthopedics;  Laterality: Right;   SHOULDER ARTHROSCOPY WITH ROTATOR CUFF REPAIR AND OPEN BICEPS TENODESIS Left 11/19/2019   Procedure: SHOULDER ARTHROSCOPY WITH ROTATOR CUFF REPAIR AND  BICEPS TENODESIS;  Surgeon: Josefina Chew, MD;  Location: [facility];  Service: Orthopedics;  Laterality: Left;   TONSILLECTOMY     TOTAL KNEE ARTHROPLASTY Right 05/07/2016   Procedure: RIGHT TOTAL KNEE ARTHROPLASTY;  Surgeon: Dempsey Moan, MD;  Location: [facility];  Service: Orthopedics;  Laterality: Right;   No family history on  file. Social History   Socioeconomic History   Marital status: Married    Spouse name: Not on file   Number of children: Not on file   Years of education: Not on file   Highest education level: Not on file  Occupational History   Not on file  Tobacco Use   Smoking status: Never   Smokeless tobacco: Never  Vaping Use   Vaping status: Never Used  Substance and Sexual Activity   Alcohol use: No   Drug use: No   Sexual activity: Not on file  Other Topics Concern   Not on file  Social History Narrative   Not on file   Social Drivers of Health   Financial Resource Strain: Not on file  Food Insecurity: Not on file  Transportation Needs: Not on file  Physical Activity: Not on file  Stress: Not on file  Social Connections: Not on file  Intimate Partner Violence: Not on file       Objective:  BP (!) 149/80   Pulse (!) 55   Ht 5' 1 (1.549 m)   Wt 238 lb (108 kg)   SpO2 96%   BMI 44.97 kg/m  Physical Exam  Gen. Pleasant, obese, in no distress ENT - no lesions, no post nasal drip Neck: No JVD, no thyromegaly, no carotid bruits Lungs: no use of accessory muscles, no dullness to percussion, decreased without rales or rhonchi  Cardiovascular: Rhythm regular, heart sounds  normal, no murmurs or gallops, no peripheral edema Musculoskeletal: No deformities, no cyanosis or clubbing , no tremors       Assessment & Plan:  Assessment and Plan Assessment & Plan Chronic dyspnea of unclear etiology Chronic dyspnea for approximately ten years, initially during exertion such as climbing stairs. Cardiac evaluation by Dr. Elder showed no significant heart disease, though some coronary artery calcium  was noted. Pulmonary function tests were normal, ruling out COPD. No evidence of lung cancer or pulmonary fibrosis on recent imaging. Echocardiogram showed no signs of pulmonary hypertension. Weight gain noted post-retirement, but dyspnea predates this. Differential includes asthma,  though not definitively diagnosed. WT gain of 30 lbs contributing - Perform a walk test to monitor heart rate and oxygen levels during exertion. - Re-evaluate in six months to assess progress and response to treatment.  Suspected asthma Asthma suspected based on symptoms of wheezing during exertion and exposure to dust. No formal diagnosis of asthma, but symptoms suggestive. Previous inhaler use did not show clear benefit. Normal pulmonary function tests do not rule out asthma, as they may not capture episodic nature of the condition. - Prescribe albuterol  MDI for use as needed during episodes of dyspnea or wheezing. - Instruct to use inhaler during symptomatic episodes, such as exertion or exposure to dust, and assess relief. - Reassess in six months to determine effectiveness of inhaler and need for further intervention.       No follow-ups on file.   Harden ROCKFORD Jude, MD

## 2024-09-28 DIAGNOSIS — K219 Gastro-esophageal reflux disease without esophagitis: Secondary | ICD-10-CM | POA: Diagnosis not present

## 2024-12-15 ENCOUNTER — Other Ambulatory Visit (HOSPITAL_BASED_OUTPATIENT_CLINIC_OR_DEPARTMENT_OTHER): Payer: Self-pay

## 2024-12-15 MED ORDER — ALBUTEROL SULFATE HFA 108 (90 BASE) MCG/ACT IN AERS: 2.0000 | INHALATION_SPRAY | Freq: Four times a day (QID) | RESPIRATORY_TRACT | 1 refills | Status: AC | PRN
